# Patient Record
Sex: Female | Born: 1945
Health system: Southern US, Community
[De-identification: ages and names within clinical notes are randomized; demographics above are authoritative.]

## PROBLEM LIST (undated history)

## (undated) DIAGNOSIS — M810 Age-related osteoporosis without current pathological fracture: Secondary | ICD-10-CM

## (undated) DIAGNOSIS — D649 Anemia, unspecified: Secondary | ICD-10-CM

## (undated) DIAGNOSIS — B001 Herpesviral vesicular dermatitis: Secondary | ICD-10-CM

## (undated) DIAGNOSIS — J45909 Unspecified asthma, uncomplicated: Secondary | ICD-10-CM

## (undated) DIAGNOSIS — R011 Cardiac murmur, unspecified: Secondary | ICD-10-CM

## (undated) DIAGNOSIS — T7840XA Allergy, unspecified, initial encounter: Secondary | ICD-10-CM

## (undated) DIAGNOSIS — Z860100 Personal history of colon polyps, unspecified: Secondary | ICD-10-CM

## (undated) DIAGNOSIS — M1990 Unspecified osteoarthritis, unspecified site: Secondary | ICD-10-CM

## (undated) HISTORY — PX: EYE SURGERY: SHX253

## (undated) HISTORY — DX: Herpesviral vesicular dermatitis: B00.1

## (undated) HISTORY — DX: Allergy, unspecified, initial encounter: T78.40XA

## (undated) HISTORY — DX: Unspecified asthma, uncomplicated: J45.909

## (undated) HISTORY — DX: Age-related osteoporosis without current pathological fracture: M81.0

## (undated) HISTORY — DX: Personal history of colonic polyps: Z86.010

## (undated) HISTORY — DX: Cardiac murmur, unspecified: R01.1

## (undated) HISTORY — DX: Anemia, unspecified: D64.9

## (undated) HISTORY — DX: Personal history of colon polyps, unspecified: Z86.0100

## (undated) HISTORY — DX: Unspecified osteoarthritis, unspecified site: M19.90

---

## 1981-10-19 HISTORY — PX: DILATION AND CURETTAGE OF UTERUS: SHX78

## 1999-10-20 DIAGNOSIS — Z5189 Encounter for other specified aftercare: Secondary | ICD-10-CM

## 1999-10-20 HISTORY — DX: Encounter for other specified aftercare: Z51.89

## 2010-10-19 HISTORY — PX: ABDOMINAL HYSTERECTOMY: SHX81

## 2013-10-19 HISTORY — PX: KNEE ARTHROSCOPY: SHX127

## 2016-10-19 LAB — HM COLONOSCOPY

## 2016-10-29 DIAGNOSIS — H25013 Cortical age-related cataract, bilateral: Secondary | ICD-10-CM | POA: Insufficient documentation

## 2016-11-09 DIAGNOSIS — M810 Age-related osteoporosis without current pathological fracture: Secondary | ICD-10-CM | POA: Insufficient documentation

## 2016-11-09 LAB — HM DEXA SCAN

## 2017-10-21 DIAGNOSIS — B001 Herpesviral vesicular dermatitis: Secondary | ICD-10-CM | POA: Insufficient documentation

## 2018-10-19 DIAGNOSIS — Z5189 Encounter for other specified aftercare: Secondary | ICD-10-CM

## 2018-10-19 HISTORY — DX: Encounter for other specified aftercare: Z51.89

## 2019-08-14 DIAGNOSIS — D72819 Decreased white blood cell count, unspecified: Secondary | ICD-10-CM | POA: Insufficient documentation

## 2020-09-03 ENCOUNTER — Encounter: Payer: Self-pay | Admitting: Physician Assistant

## 2020-09-03 ENCOUNTER — Other Ambulatory Visit: Payer: Self-pay

## 2020-09-03 ENCOUNTER — Ambulatory Visit (INDEPENDENT_AMBULATORY_CARE_PROVIDER_SITE_OTHER): Payer: Medicare PPO | Admitting: Physician Assistant

## 2020-09-03 VITALS — BP 130/70 | HR 64 | Temp 98.2°F | Resp 16 | Ht 65.5 in | Wt 138.0 lb

## 2020-09-03 DIAGNOSIS — B001 Herpesviral vesicular dermatitis: Secondary | ICD-10-CM

## 2020-09-03 DIAGNOSIS — H9313 Tinnitus, bilateral: Secondary | ICD-10-CM

## 2020-09-03 DIAGNOSIS — K219 Gastro-esophageal reflux disease without esophagitis: Secondary | ICD-10-CM

## 2020-09-03 DIAGNOSIS — R011 Cardiac murmur, unspecified: Secondary | ICD-10-CM

## 2020-09-03 DIAGNOSIS — Z86010 Personal history of colonic polyps: Secondary | ICD-10-CM | POA: Diagnosis not present

## 2020-09-03 DIAGNOSIS — J302 Other seasonal allergic rhinitis: Secondary | ICD-10-CM | POA: Insufficient documentation

## 2020-09-03 LAB — COMPREHENSIVE METABOLIC PANEL
ALT: 15 U/L (ref 0–35)
AST: 18 U/L (ref 0–37)
Albumin: 4.5 g/dL (ref 3.5–5.2)
Alkaline Phosphatase: 64 U/L (ref 39–117)
BUN: 20 mg/dL (ref 6–23)
CO2: 29 mEq/L (ref 19–32)
Calcium: 9.3 mg/dL (ref 8.4–10.5)
Chloride: 104 mEq/L (ref 96–112)
Creatinine, Ser: 0.74 mg/dL (ref 0.40–1.20)
GFR: 80.22 mL/min (ref >60.00)
Glucose, Bld: 74 mg/dL (ref 70–99)
Potassium: 4.6 mEq/L (ref 3.5–5.1)
Sodium: 139 mEq/L (ref 135–145)
Total Bilirubin: 0.5 mg/dL (ref 0.2–1.2)
Total Protein: 7 g/dL (ref 6.0–8.3)

## 2020-09-03 LAB — CBC WITH DIFFERENTIAL/PLATELET
Basophils Absolute: 0 10*3/uL (ref 0.0–0.1)
Basophils Relative: 0.6 % (ref 0.0–3.0)
Eosinophils Absolute: 0.1 10*3/uL (ref 0.0–0.7)
Eosinophils Relative: 3.4 % (ref 0.0–5.0)
HCT: 41.2 % (ref 36.0–46.0)
Hemoglobin: 13.9 g/dL (ref 12.0–15.0)
Lymphocytes Relative: 31.5 % (ref 12.0–46.0)
Lymphs Abs: 1.3 10*3/uL (ref 0.7–4.0)
MCHC: 33.6 g/dL (ref 30.0–36.0)
MCV: 92.1 fl (ref 78.0–100.0)
Monocytes Absolute: 0.4 10*3/uL (ref 0.1–1.0)
Monocytes Relative: 8.4 % (ref 3.0–12.0)
Neutro Abs: 2.3 10*3/uL (ref 1.4–7.7)
Neutrophils Relative %: 56.1 % (ref 43.0–77.0)
Platelets: 235 10*3/uL (ref 150.0–400.0)
RBC: 4.48 Mil/uL (ref 3.87–5.11)
RDW: 13.4 % (ref 11.5–15.5)
WBC: 4.2 10*3/uL (ref 4.0–10.5)

## 2020-09-03 LAB — VITAMIN B12: Vitamin B-12: 742 pg/mL (ref 211–911)

## 2020-09-03 MED ORDER — PANTOPRAZOLE SODIUM 40 MG PO TBEC: 40.0000 mg | DELAYED_RELEASE_TABLET | Freq: Every day | ORAL | 3 refills | Status: DC

## 2020-09-03 MED ORDER — VALACYCLOVIR HCL 1 G PO TABS: 1000.0000 mg | ORAL_TABLET | Freq: Two times a day (BID) | ORAL | 1 refills | Status: DC

## 2020-09-03 NOTE — Progress Notes (Signed)
Patient presents to clinic today to establish care.  Acute Concerns: Patient endorses 1+ month of constant ringing in the ears bilaterally with L > R. Denies any dizziness, ear pressure or popping. Notes some slight change in hearing bilaterally.. Notes having a hearing assessment in the summer -- some mild hearing loss noted at that time. Will need records.   Patient also having intermittent episodes of heartburn with upper abdominal discomfort, bloating and cramping. Denies nausea or vomiting. Denies chronic cough or globus sensation. Denies blood in the stool. Occasionally taking Pepto Bismol. .   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   Osteoporosis -- notes on last DEXA in 2019. She states she takesher Calcium and OTC Vitamin D. Stays active. Refuses to start medication for the osteoporosis.   Health Maintenance: Immunizations -- up-to-date.   Colonoscopy -- up-to-date.  Mammogram -- 2021 per patient. Records requested.  Past Medical History:  Diagnosis Date   Fever blister    History of colon polyps     Past Surgical History:  Procedure Laterality Date   ABDOMINAL HYSTERECTOMY  2012    Current Outpatient Medications on File Prior to Visit  Medication Sig Dispense Refill   valACYclovir (VALTREX) 1000 MG tablet Take 1,000 mg by mouth 2 (two) times daily.     No current facility-administered medications on file prior to visit.    Allergies  Allergen Reactions   Clindamycin Rash   Erythromycin Base Rash   Levofloxacin Rash    History reviewed. No pertinent family history.  Social History   Socioeconomic History   Marital status: Married    Spouse name: Not on file   Number of children: Not on file   Years of education: Not on file   Highest education level: Not on file  Occupational History   Not on file  Tobacco Use   Smoking status: Never Smoker   Smokeless tobacco: Never Used  Vaping Use   Vaping Use:  Never used  Substance and Sexual Activity   Alcohol use: Not Currently   Drug use: Never   Sexual activity: Not Currently    Birth control/protection: Surgical  Other Topics Concern   Not on file  Social History Narrative   Not on file   Social Determinants of Health   Financial Resource Strain:    Difficulty of Paying Living Expenses: Not on file  Food Insecurity:    Worried About [place] in the Last Year: Not on file   Ran Out of Food in the Last Year: Not on file  Transportation Needs:    Lack of Transportation (Medical): Not on file   Lack of Transportation (Non-Medical): Not on file  Physical Activity:    Days of Exercise per Week: Not on file   Minutes of Exercise per Session: Not on file  Stress:    Feeling of Stress : Not on file  Social Connections:    Frequency of Communication with Friends and Family: Not on file   Frequency of Social Gatherings with Friends and Family: Not on file   Attends Religious Services: Not on file   Active Member of Clubs or Organizations: Not on file   Attends Archivist Meetings: Not on file   Marital Status: Not on file  Intimate Partner Violence:    Fear of Current or Ex-Partner: Not on file   Emotionally Abused: Not on file   Physically Abused: Not on file   Sexually Abused:  Not on file   ROS Pertinent ROS are listed in the HPI.   BP 130/70    Pulse 64    Temp 98.2 F (36.8 C) (Temporal)    Resp 16    Ht 5' 5.5" (1.664 m)    Wt 138 lb (62.6 kg)    SpO2 99%    BMI 22.62 kg/m   Physical Exam Vitals reviewed.  Constitutional:      Appearance: Normal appearance.  HENT:     Head: Normocephalic and atraumatic.     Right Ear: Tympanic membrane and ear canal normal.     Left Ear: Tympanic membrane normal.     Mouth/Throat:     Mouth: Mucous membranes are moist.  Cardiovascular:     Rate and Rhythm: Normal rate and regular rhythm.     Pulses: Normal pulses.     Heart sounds: No  murmur (chronic SEM, II.VI) heard.   Pulmonary:     Effort: Pulmonary effort is normal.     Breath sounds: Normal breath sounds.  Abdominal:     General: There is no distension.     Palpations: There is no mass.     Tenderness: There is no abdominal tenderness. There is no guarding or rebound.  Musculoskeletal:     Cervical back: Normal range of motion and neck supple.  Neurological:     General: No focal deficit present.     Mental Status: She is alert and oriented to person, place, and time.  Psychiatric:        Mood and Affect: Mood normal.    Assessment/Plan: 1. Gastroesophageal reflux disease without esophagitis Will check CBC, CMP and H pylor today. Will start Protonix 40 mg daily. Dietary recommendations and OTC medications reviewed. Will adjust treatment based on results and response to PPI trial.  - CBC with Differential/Platelet - Comprehensive metabolic panel - H. pylori antibody, IgG  2. Bilateral tinnitus Likely related to mild hearing loss. b12 level today. If norma can refer to ENT for further evaluation.  - B12  3. Fever blister Continue Valtrex PRN for rare outbreak.  - valACYclovir (VALTREX) 1000 MG tablet; Take 1 tablet (1,000 mg total) by mouth 2 (two) times daily.  Dispense: 60 tablet; Refill: 1  4. History of colon polyps UTD on colonoscopy. Followed by GI in [place]. Records requested.   5. Heart murmur Asymptomatic. Continue surveillance withCardiology.   This visit occurred during the SARS-CoV-2 public health emergency.  Safety protocols were in place, including screening questions prior to the visit, additional usage of staff PPE, and extensive cleaning of exam room while observing appropriate contact time as indicated for disinfecting solutions.     Leeanne Rio, PA-C

## 2020-09-03 NOTE — Patient Instructions (Addendum)
Please go to the lab today for blood work.  I will call you with your results. We will alter treatment regimen(s) if indicated by your results.   Please take the Protonix once daily.  Follow the dietary recommendations below. If labs are negative and symptoms not improving we will get a further assessment.   For the ringing in the ears, I am checking some levels today. Most likely this is related to hearing loss but we need to assess. If labs are negative I will set you up with ENT.   It was very nice meeting you today. Welcome to [facility]!

## 2020-09-04 LAB — H. PYLORI ANTIBODY, IGG: H Pylori IgG: NEGATIVE

## 2020-09-18 ENCOUNTER — Ambulatory Visit: Payer: Medicare PPO | Admitting: Physician Assistant

## 2020-09-25 ENCOUNTER — Ambulatory Visit: Payer: Medicare PPO | Admitting: Physician Assistant

## 2020-09-25 ENCOUNTER — Other Ambulatory Visit: Payer: Self-pay

## 2020-09-25 ENCOUNTER — Encounter: Payer: Self-pay | Admitting: Physician Assistant

## 2020-09-25 VITALS — BP 122/80 | HR 65 | Temp 98.4°F | Resp 14 | Ht 65.5 in | Wt 138.0 lb

## 2020-09-25 DIAGNOSIS — H9313 Tinnitus, bilateral: Secondary | ICD-10-CM

## 2020-09-25 DIAGNOSIS — K219 Gastro-esophageal reflux disease without esophagitis: Secondary | ICD-10-CM | POA: Diagnosis not present

## 2020-09-25 NOTE — Progress Notes (Signed)
Patient presents to clinic today for follow-up of GERD. At last visit patient was placed on a 2-week trial of Pantoprazole and GERD diet for her symptoms. Endorses taking medication as directed with improvement in her symptoms. Notes increase in arthritic pain of her hands and so she stopped PPI with resolution of this. Has been continuing to watch her diet without recurrence of symptoms.   Past Medical History:  Diagnosis Date  . Allergy   . Anemia   . Arthritis   . Asthma   . Blood transfusion without reported diagnosis 2020  . Fever blister   . Heart murmur   . History of colon polyps   . Osteoporosis     Current Outpatient Medications on File Prior to Visit  Medication Sig Dispense Refill  . pantoprazole (PROTONIX) 40 MG tablet Take 1 tablet (40 mg total) by mouth daily. (Patient not taking: Reported on 09/25/2020) 30 tablet 3  . valACYclovir (VALTREX) 1000 MG tablet Take 1 tablet (1,000 mg total) by mouth 2 (two) times daily. 60 tablet 1   No current facility-administered medications on file prior to visit.    Allergies  Allergen Reactions  . Clindamycin Rash  . Erythromycin Base Rash  . Levofloxacin Rash    Family History  Problem Relation Age of Onset  . Arthritis Mother   . Hearing loss Mother   . Miscarriages / [place] Mother   . Stroke Mother   . Cancer Father   . Heart attack Father   . Stroke Father   . Stroke Maternal Grandmother   . Stroke Maternal Grandfather   . Stroke Paternal Grandmother   . Stroke Paternal Grandfather   . Arthritis Brother   . Cancer Brother     Social History   Socioeconomic History  . Marital status: Married    Spouse name: Not on file  . Number of children: Not on file  . Years of education: Not on file  . Highest education level: Not on file  Occupational History  . Not on file  Tobacco Use  . Smoking status: Former Smoker    Types: Cigarettes  . Smokeless tobacco: Never Used  Vaping Use  . Vaping Use: Never  used  Substance and Sexual Activity  . Alcohol use: Not Currently  . Drug use: Never  . Sexual activity: Not Currently    Birth control/protection: Surgical  Other Topics Concern  . Not on file  Social History Narrative  . Not on file   Social Determinants of Health   Financial Resource Strain:   . Difficulty of Paying Living Expenses: Not on file  Food Insecurity:   . Worried About [employer] in the Last Year: Not on file  . Ran Out of Food in the Last Year: Not on file  Transportation Needs:   . Lack of Transportation (Medical): Not on file  . Lack of Transportation (Non-Medical): Not on file  Physical Activity:   . Days of Exercise per Week: Not on file  . Minutes of Exercise per Session: Not on file  Stress:   . Feeling of Stress : Not on file  Social Connections:   . Frequency of Communication with Friends and Family: Not on file  . Frequency of Social Gatherings with Friends and Family: Not on file  . Attends Religious Services: Not on file  . Active Member of Clubs or Organizations: Not on file  . Attends Archivist Meetings: Not on file  . Marital  Status: Not on file    Review of Systems - See HPI.  All other ROS are negative.  There were no vitals taken for this visit.  Physical Exam  Recent Results (from the past 2160 hour(s))  CBC with Differential/Platelet     Status: None   Collection Time: 09/03/20 11:21 AM  Result Value Ref Range   WBC 4.2 4.0 - 10.5 K/uL   RBC 4.48 3.87 - 5.11 Mil/uL   Hemoglobin 13.9 12.0 - 15.0 g/dL   HCT 41.2 36 - 46 %   MCV 92.1 78.0 - 100.0 fl   MCHC 33.6 30.0 - 36.0 g/dL   RDW 13.4 11.5 - 15.5 %   Platelets 235.0 150 - 400 K/uL   Neutrophils Relative % 56.1 43 - 77 %   Lymphocytes Relative 31.5 12 - 46 %   Monocytes Relative 8.4 3 - 12 %   Eosinophils Relative 3.4 0 - 5 %   Basophils Relative 0.6 0 - 3 %   Neutro Abs 2.3 1.4 - 7.7 K/uL   Lymphs Abs 1.3 0.7 - 4.0 K/uL   Monocytes Absolute 0.4 0.1 -  1.0 K/uL   Eosinophils Absolute 0.1 0.0 - 0.7 K/uL   Basophils Absolute 0.0 0.0 - 0.1 K/uL  Comprehensive metabolic panel     Status: None   Collection Time: 09/03/20 11:21 AM  Result Value Ref Range   Sodium 139 135 - 145 mEq/L   Potassium 4.6 3.5 - 5.1 mEq/L   Chloride 104 96 - 112 mEq/L   CO2 29 19 - 32 mEq/L   Glucose, Bld 74 70 - 99 mg/dL   BUN 20 6 - 23 mg/dL   Creatinine, Ser 0.74 0.40 - 1.20 mg/dL   Total Bilirubin 0.5 0.2 - 1.2 mg/dL   Alkaline Phosphatase 64 39 - 117 U/L   AST 18 0 - 37 U/L   ALT 15 0 - 35 U/L   Total Protein 7.0 6.0 - 8.3 g/dL   Albumin 4.5 3.5 - 5.2 g/dL   GFR 80.22 >60.00 mL/min    Comment: Calculated using the CKD-EPI Creatinine Equation (2021)   Calcium 9.3 8.4 - 10.5 mg/dL  B12     Status: None   Collection Time: 09/03/20 11:21 AM  Result Value Ref Range   Vitamin B-12 742 211 - 911 pg/mL  H. pylori antibody, IgG     Status: None   Collection Time: 09/03/20 11:21 AM  Result Value Ref Range   H Pylori IgG Negative Negative    Assessment/Plan: 1. Gastroesophageal reflux disease without esophagitis Doing well now even off of her PPI. Protonix added to intolerance list. Continue GERD diet. Can consider PRN H2 blocker -- Famotidine recommended. Follow-up prn for recurring symptoms.   2. Bilateral tinnitus B12 normal on recent labs. Will proceed with ENT assessment. Referral placed.   This visit occurred during the SARS-CoV-2 public health emergency.  Safety protocols were in place, including screening questions prior to the visit, additional usage of staff PPE, and extensive cleaning of exam room while observing appropriate contact time as indicated for disinfecting solutions.     Leeanne Rio, PA-C

## 2020-09-25 NOTE — Patient Instructions (Signed)
I am glad things are much improved.  Continue following dietary recommendations below.  You can use OTC Pepcid (Famotidine) when needed for rare symptom recurrence.  If you note symptoms recurring and persistent, please contact me.   You will be contacted by ENT for further evaluation of tinnitus.    Food Choices for Gastroesophageal Reflux Disease, Adult When you have gastroesophageal reflux disease (GERD), the foods you eat and your eating habits are very important. Choosing the right foods can help ease your discomfort. Think about working with a nutrition specialist (dietitian) to help you make good choices. What are tips for following this plan?  Meals  Choose healthy foods that are low in fat, such as fruits, vegetables, whole grains, low-fat dairy products, and lean meat, fish, and poultry.  Eat small meals often instead of 3 large meals a day. Eat your meals slowly, and in a place where you are relaxed. Avoid bending over or lying down until 2-3 hours after eating.  Avoid eating meals 2-3 hours before bed.  Avoid drinking a lot of liquid with meals.  Cook foods using methods other than frying. Bake, grill, or broil food instead.  Avoid or limit: ? Chocolate. ? Peppermint or spearmint. ? Alcohol. ? Pepper. ? Black and decaffeinated coffee. ? Black and decaffeinated tea. ? Bubbly (carbonated) soft drinks. ? Caffeinated energy drinks and soft drinks.  Limit high-fat foods such as: ? Fatty meat or fried foods. ? Whole milk, cream, butter, or ice cream. ? Nuts and nut butters. ? Pastries, donuts, and sweets made with butter or shortening.  Avoid foods that cause symptoms. These foods may be different for everyone. Common foods that cause symptoms include: ? Tomatoes. ? Oranges, lemons, and limes. ? Peppers. ? Spicy food. ? Onions and garlic. ? Vinegar. Lifestyle  Maintain a healthy weight. Ask your doctor what weight is healthy for you. If you need to lose weight,  work with your doctor to do so safely.  Exercise for at least 30 minutes for 5 or more days each week, or as told by your doctor.  Wear loose-fitting clothes.  Do not smoke. If you need help quitting, ask your doctor.  Sleep with the head of your bed higher than your feet. Use a wedge under the mattress or blocks under the bed frame to raise the head of the bed. Summary  When you have gastroesophageal reflux disease (GERD), food and lifestyle choices are very important in easing your symptoms.  Eat small meals often instead of 3 large meals a day. Eat your meals slowly, and in a place where you are relaxed.  Limit high-fat foods such as fatty meat or fried foods.  Avoid bending over or lying down until 2-3 hours after eating.  Avoid peppermint and spearmint, caffeine, alcohol, and chocolate. This information is not intended to replace advice given to you by your health care provider. Make sure you discuss any questions you have with your health care provider. Document Revised: 01/26/2019 Document Reviewed: 11/10/2016 Elsevier Patient Education  [place].

## 2020-10-10 NOTE — Progress Notes (Signed)
Subjective:   Sylvia Bowen is a 74 y.o. female who presents for an Initial Medicare Annual Wellness Visit.  I connected with Christeen today by telephone and verified that I am speaking with the correct person using two identifiers. Location patient: home Location provider: work Persons participating in the virtual visit: patient, Engineer, civil (consulting).    I discussed the limitations, risks, security and privacy concerns of performing an evaluation and management service by telephone and the availability of in person appointments. I also discussed with the patient that there may be a patient responsible charge related to this service. The patient expressed understanding and verbally consented to this telephonic visit.    Interactive audio and video telecommunications were attempted between this provider and patient, however failed, due to patient having technical difficulties OR patient did not have access to video capability.  We continued and completed visit with audio only.  Some vital signs may be absent or patient reported.   Time Spent with patient on telephone encounter: 20 minutes  Review of Systems     Cardiac Risk Factors include: advanced age (>74men, >51 women)     Objective:    Today's Vitals   10/14/20 1332 10/14/20 1333  Weight: 138 lb (62.6 kg)   Height: 5' 5.5" (1.664 m)   PainSc:  4    Body mass index is 22.62 kg/m.  Advanced Directives 10/14/2020  Does Patient Have a Medical Advance Directive? No  Would patient like information on creating a medical advance directive? No - Patient declined    Current Medications (verified) Outpatient Encounter Medications as of 10/14/2020  Medication Sig  . ibuprofen (ADVIL) 600 MG tablet Take by mouth.  . valACYclovir (VALTREX) 1000 MG tablet Take 1 tablet (1,000 mg total) by mouth 2 (two) times daily.  . Cholecalciferol 25 MCG (1000 UT) tablet Take by mouth.  . pantoprazole (PROTONIX) 40 MG tablet Take 1 tablet (40 mg total) by  mouth daily. (Patient not taking: Reported on 10/14/2020)   No facility-administered encounter medications on file as of 10/14/2020.    Allergies (verified) Clindamycin, Erythromycin base, and Levofloxacin   History: Past Medical History:  Diagnosis Date  . Allergy   . Anemia   . Arthritis   . Asthma   . Blood transfusion without reported diagnosis 2020  . Fever blister   . Heart murmur   . History of colon polyps   . Osteoporosis    Past Surgical History:  Procedure Laterality Date  . ABDOMINAL HYSTERECTOMY  2012  . DILATION AND CURETTAGE OF UTERUS  1983  . KNEE ARTHROSCOPY  2015   Family History  Problem Relation Age of Onset  . Arthritis Mother   . Hearing loss Mother   . Miscarriages / [place] Mother   . Stroke Mother   . Cancer Father   . Heart attack Father   . Stroke Father   . Stroke Maternal Grandmother   . Stroke Maternal Grandfather   . Stroke Paternal Grandmother   . Stroke Paternal Grandfather   . Arthritis Brother   . Cancer Brother    Social History   Socioeconomic History  . Marital status: Married    Spouse name: Not on file  . Number of children: Not on file  . Years of education: Not on file  . Highest education level: Not on file  Occupational History  . Not on file  Tobacco Use  . Smoking status: Former Smoker    Types: Cigarettes  . Smokeless tobacco:  Never Used  Vaping Use  . Vaping Use: Never used  Substance and Sexual Activity  . Alcohol use: Not Currently  . Drug use: Never  . Sexual activity: Not Currently    Birth control/protection: Surgical  Other Topics Concern  . Not on file  Social History Narrative  . Not on file   Social Determinants of Health   Financial Resource Strain: Low Risk   . Difficulty of Paying Living Expenses: Not hard at all  Food Insecurity: No Food Insecurity  . Worried About Charity fundraiser in the Last Year: Never true  . Ran Out of Food in the Last Year: Never true  Transportation  Needs: No Transportation Needs  . Lack of Transportation (Medical): No  . Lack of Transportation (Non-Medical): No  Physical Activity: Inactive  . Days of Exercise per Week: 0 days  . Minutes of Exercise per Session: 0 min  Stress: No Stress Concern Present  . Feeling of Stress : Not at all  Social Connections: Moderately Integrated  . Frequency of Communication with Friends and Family: More than three times a week  . Frequency of Social Gatherings with Friends and Family: More than three times a week  . Attends Religious Services: More than 4 times per year  . Active Member of Clubs or Organizations: No  . Attends Archivist Meetings: Never  . Marital Status: Married    Tobacco Counseling Counseling given: Not Answered   Clinical Intake:  Pre-visit preparation completed: Yes  Pain : 0-10 Pain Score: 4  Pain Type: Acute pain Pain Location: Rib cage Pain Orientation: Right Pain Onset: In the past 7 days Pain Frequency: Intermittent     Nutritional Status: BMI of 19-24  Normal Nutritional Risks: None Diabetes: No  How often do you need to have someone help you when you read instructions, pamphlets, or other written materials from your doctor or pharmacy?: 1 - Never What is the last grade level you completed in school?: Master's degree  Diabetic?No  Interpreter Needed?: No  Information entered by :: Caroleen Hamman LPN   Activities of Daily Living In your present state of health, do you have any difficulty performing the following activities: 10/14/2020 09/03/2020  Hearing? N N  Vision? N N  Difficulty concentrating or making decisions? N N  Walking or climbing stairs? N N  Dressing or bathing? N N  Doing errands, shopping? N N  Preparing Food and eating ? N -  Using the Toilet? N -  In the past six months, have you accidently leaked urine? N -  Do you have problems with loss of bowel control? N -  Managing your Medications? N -  Managing your  Finances? N -  Housekeeping or managing your Housekeeping? N -  Some recent data might be hidden    Patient Care Team: Brunetta Jeans, PA-C as PCP - General (Family Medicine) Lars Masson, MD as Referring Physician (Cardiology)  Indicate any recent Medical Services you may have received from other than [facility] providers in the past year (date may be approximate).     Assessment:   This is a routine wellness examination for Siennah.  Hearing/Vision screen  Hearing Screening   125Hz  250Hz  500Hz  1000Hz  2000Hz  3000Hz  4000Hz  6000Hz  8000Hz   Right ear:           Left ear:           Comments: Has an upcoming appt with a hearing specialist for tinnitus  Vision Screening  Comments: Wears glasses Last eye exam-2021-[facility]  Dietary issues and exercise activities discussed: Current Exercise Habits: Home exercise routine, Type of exercise: stretching;yoga, Time (Minutes): 60, Frequency (Times/Week): 2, Weekly Exercise (Minutes/Week): 120, Intensity: Mild, Exercise limited by: None identified  Goals    . Patient Stated     Increase activity, drink more water & eat healthier      Depression Screen PHQ 2/9 Scores 10/14/2020 09/03/2020  PHQ - 2 Score 0 0  PHQ- 9 Score - 0    Fall Risk Fall Risk  10/14/2020 09/03/2020  Falls in the past year? 1 0  Number falls in past yr: 0 0  Injury with Fall? 1 0  Risk for fall due to : History of fall(s) -  Follow up Falls prevention discussed Falls evaluation completed    FALL RISK PREVENTION PERTAINING TO THE HOME:  Any stairs in or around the home? No  Home free of loose throw rugs in walkways, pet beds, electrical cords, etc? Yes  Adequate lighting in your home to reduce risk of falls? Yes   ASSISTIVE DEVICES UTILIZED TO PREVENT FALLS:  Life alert? No  Use of a cane, walker or w/c? No  Grab bars in the bathroom? No  Shower chair or bench in shower? No  Elevated toilet seat or a handicapped toilet? No   TIMED UP AND  GO:  Was the test performed? No . Phone visit   Cognitive Function:Normal cognitive status assessed by  this Nurse Health Advisor. No abnormalities found.          Immunizations Immunization History  Administered Date(s) Administered  . Influenza, High Dose Seasonal PF 06/17/2017, 07/21/2019  . Janssen (J&J) SARS-COV-2 Vaccination 01/29/2020  . Pneumococcal Conjugate-13 09/24/2016  . Pneumococcal Polysaccharide-23 10/21/2017  . Tdap 10/19/2013  . Zoster Recombinat (Shingrix) 08/11/2019, 10/31/2019    TDAP status: Up to date  Flu Vaccine status: Due, Education has been provided regarding the importance of this vaccine. Advised may receive this vaccine at local pharmacy or Health Dept. Aware to provide a copy of the vaccination record if obtained from local pharmacy or Health Dept. Verbalized acceptance and understanding.  Pneumococcal vaccine status: Up to date  Covid-19 vaccine status: Completed vaccines  Qualifies for Shingles Vaccine? No   Zostavax completed No   Shingrix Completed?: Yes  Screening Tests Health Maintenance  Topic Date Due  . Hepatitis C Screening  Never done  . MAMMOGRAM  Never done  . COVID-19 Vaccine (2 - Booster for [employer] series) 03/25/2020  . INFLUENZA VACCINE  05/19/2020  . DTAP VACCINES (1) 03/26/2079 (Originally 05/05/1946)  . DTaP/Tdap/Td (2 - Td or Tdap) 10/20/2023  . TETANUS/TDAP  10/20/2023  . COLONOSCOPY (Pts 45-24yrs Insurance coverage will need to be confirmed)  10/19/2026  . DEXA SCAN  Completed  . PNA vac Low Risk Adult  Completed    Health Maintenance  Health Maintenance Due  Topic Date Due  . Hepatitis C Screening  Never done  . MAMMOGRAM  Never done  . COVID-19 Vaccine (2 - Booster for [employer] series) 03/25/2020  . INFLUENZA VACCINE  05/19/2020    Colorectal cancer screening: Type of screening: Colonoscopy. Completed 10/19/2016. Repeat every 10 years  Mammogram status: Completed Bilateral 2021-per patient. Repeat every  year. Requested patient have a copy of report sent to PCP  Bone density status:Mammogram status: Completed 2021-per patient. Requested patient have a copy of report sent to PCP  Lung Cancer Screening: (Low Dose CT Chest recommended if Age  55-80 years, 30 pack-year currently smoking OR have quit w/in 15years.) does not qualify.     Additional Screening:  Hepatitis C Screening: does qualify; Discuss with PCP  Vision Screening: Recommended annual ophthalmology exams for early detection of glaucoma and other disorders of the eye. Is the patient up to date with their annual eye exam?  Yes  Who is the provider or what is the name of the office in which the patient attends annual eye exams? [facility]   Dental Screening: Recommended annual dental exams for proper oral hygiene  Community Resource Referral / Chronic Care Management: CRR required this visit?  No   CCM required this visit?  No      Plan:     I have personally reviewed and noted the following in the patient's chart:   . Medical and social history . Use of alcohol, tobacco or illicit drugs  . Current medications and supplements . Functional ability and status . Nutritional status . Physical activity . Advanced directives . List of other physicians . Hospitalizations, surgeries, and ER visits in previous 12 months . Vitals . Screenings to include cognitive, depression, and falls . Referrals and appointments  In addition, I have reviewed and discussed with patient certain preventive protocols, quality metrics, and best practice recommendations. A written personalized care plan for preventive services as well as general preventive health recommendations were provided to patient.   Due to this being a telephonic visit, the after visit summary with patients personalized plan was offered to patient via mail or my-chart. Patient declined at this time.   Marta Antu, LPN   [id number]  Nurse Health  Advisor  Nurse Notes: None

## 2020-10-14 ENCOUNTER — Other Ambulatory Visit: Payer: Self-pay

## 2020-10-14 ENCOUNTER — Ambulatory Visit (INDEPENDENT_AMBULATORY_CARE_PROVIDER_SITE_OTHER): Payer: Medicare PPO

## 2020-10-14 VITALS — Ht 65.5 in | Wt 138.0 lb

## 2020-10-14 DIAGNOSIS — Z0000 Encounter for general adult medical examination without abnormal findings: Secondary | ICD-10-CM

## 2020-10-14 NOTE — Patient Instructions (Signed)
Sylvia Bowen , Thank you for taking time to complete your Medicare Wellness Visit. I appreciate your ongoing commitment to your health goals. Please review the following plan we discussed and let me know if I can assist you in the future.   Screening recommendations/referrals: Colonoscopy: Completed 10/19/2016-Not required after age 73. Mammogram: Per our conversation, completed 2021. Please have copy of results sent to PCP. Bone Density: Per our conversation, completed 2021. Please have copy of results sent to PCP Recommended yearly ophthalmology/optometry visit for glaucoma screening and checkup Recommended yearly dental visit for hygiene and checkup  Vaccinations: Influenza vaccine: Due- May obtain vaccine at our office or your local pharmacy. Pneumococcal vaccine: Completed vaccines Tdap vaccine: Up to date. Due-10/20/2023 Shingles vaccine: Completed vaccines  Covid-19:Completed vaccines  Advanced directives: Please bring a copy for your chart  Conditions/risks identified: See problem list  Next appointment: Follow up in one year for your annual wellness visit    Preventive Care 65 Years and Older, Female Preventive care refers to lifestyle choices and visits with your health care provider that can promote health and wellness. What does preventive care include?  A yearly physical exam. This is also called an annual well check.  Dental exams once or twice a year.  Routine eye exams. Ask your health care provider how often you should have your eyes checked.  Personal lifestyle choices, including:  Daily care of your teeth and gums.  Regular physical activity.  Eating a healthy diet.  Avoiding tobacco and drug use.  Limiting alcohol use.  Practicing safe sex.  Taking low-dose aspirin every day.  Taking vitamin and mineral supplements as recommended by your health care provider. What happens during an annual well check? The services and screenings done by your health care  provider during your annual well check will depend on your age, overall health, lifestyle risk factors, and family history of disease. Counseling  Your health care provider may ask you questions about your:  Alcohol use.  Tobacco use.  Drug use.  Emotional well-being.  Home and relationship well-being.  Sexual activity.  Eating habits.  History of falls.  Memory and ability to understand (cognition).  Work and work Astronomer.  Reproductive health. Screening  You may have the following tests or measurements:  Height, weight, and BMI.  Blood pressure.  Lipid and cholesterol levels. These may be checked every 5 years, or more frequently if you are over 74 years old.  Skin check.  Lung cancer screening. You may have this screening every year starting at age 84 if you have a 30-pack-year history of smoking and currently smoke or have quit within the past 15 years.  Fecal occult blood test (FOBT) of the stool. You may have this test every year starting at age 68.  Flexible sigmoidoscopy or colonoscopy. You may have a sigmoidoscopy every 5 years or a colonoscopy every 10 years starting at age 73.  Hepatitis C blood test.  Hepatitis B blood test.  Sexually transmitted disease (STD) testing.  Diabetes screening. This is done by checking your blood sugar (glucose) after you have not eaten for a while (fasting). You may have this done every 1-3 years.  Bone density scan. This is done to screen for osteoporosis. You may have this done starting at age 53.  Mammogram. This may be done every 1-2 years. Talk to your health care provider about how often you should have regular mammograms. Talk with your health care provider about your test results, treatment options, and  if necessary, the need for more tests. Vaccines  Your health care provider may recommend certain vaccines, such as:  Influenza vaccine. This is recommended every year.  Tetanus, diphtheria, and acellular  pertussis (Tdap, Td) vaccine. You may need a Td booster every 10 years.  Zoster vaccine. You may need this after age 40.  Pneumococcal 13-valent conjugate (PCV13) vaccine. One dose is recommended after age 57.  Pneumococcal polysaccharide (PPSV23) vaccine. One dose is recommended after age 3. Talk to your health care provider about which screenings and vaccines you need and how often you need them. This information is not intended to replace advice given to you by your health care provider. Make sure you discuss any questions you have with your health care provider. Document Released: 11/01/2015 Document Revised: 06/24/2016 Document Reviewed: 08/06/2015 Elsevier Interactive Patient Education  2017 [place] Prevention in the Home Falls can cause injuries. They can happen to people of all ages. There are many things you can do to make your home safe and to help prevent falls. What can I do on the outside of my home?  Regularly fix the edges of walkways and driveways and fix any cracks.  Remove anything that might make you trip as you walk through a door, such as a raised step or threshold.  Trim any bushes or trees on the path to your home.  Use bright outdoor lighting.  Clear any walking paths of anything that might make someone trip, such as rocks or tools.  Regularly check to see if handrails are loose or broken. Make sure that both sides of any steps have handrails.  Any raised decks and porches should have guardrails on the edges.  Have any leaves, snow, or ice cleared regularly.  Use sand or salt on walking paths during winter.  Clean up any spills in your garage right away. This includes oil or grease spills. What can I do in the bathroom?  Use night lights.  Install grab bars by the toilet and in the tub and shower. Do not use towel bars as grab bars.  Use non-skid mats or decals in the tub or shower.  If you need to sit down in the shower, use a plastic,  non-slip stool.  Keep the floor dry. Clean up any water that spills on the floor as soon as it happens.  Remove soap buildup in the tub or shower regularly.  Attach bath mats securely with double-sided non-slip rug tape.  Do not have throw rugs and other things on the floor that can make you trip. What can I do in the bedroom?  Use night lights.  Make sure that you have a light by your bed that is easy to reach.  Do not use any sheets or blankets that are too big for your bed. They should not hang down onto the floor.  Have a firm chair that has side arms. You can use this for support while you get dressed.  Do not have throw rugs and other things on the floor that can make you trip. What can I do in the kitchen?  Clean up any spills right away.  Avoid walking on wet floors.  Keep items that you use a lot in easy-to-reach places.  If you need to reach something above you, use a strong step stool that has a grab bar.  Keep electrical cords out of the way.  Do not use floor polish or wax that makes floors slippery. If you  must use wax, use non-skid floor wax.  Do not have throw rugs and other things on the floor that can make you trip. What can I do with my stairs?  Do not leave any items on the stairs.  Make sure that there are handrails on both sides of the stairs and use them. Fix handrails that are broken or loose. Make sure that handrails are as long as the stairways.  Check any carpeting to make sure that it is firmly attached to the stairs. Fix any carpet that is loose or worn.  Avoid having throw rugs at the top or bottom of the stairs. If you do have throw rugs, attach them to the floor with carpet tape.  Make sure that you have a light switch at the top of the stairs and the bottom of the stairs. If you do not have them, ask someone to add them for you. What else can I do to help prevent falls?  Wear shoes that:  Do not have high heels.  Have rubber  bottoms.  Are comfortable and fit you well.  Are closed at the toe. Do not wear sandals.  If you use a stepladder:  Make sure that it is fully opened. Do not climb a closed stepladder.  Make sure that both sides of the stepladder are locked into place.  Ask someone to hold it for you, if possible.  Clearly mark and make sure that you can see:  Any grab bars or handrails.  First and last steps.  Where the edge of each step is.  Use tools that help you move around (mobility aids) if they are needed. These include:  Canes.  Walkers.  Scooters.  Crutches.  Turn on the lights when you go into a dark area. Replace any light bulbs as soon as they burn out.  Set up your furniture so you have a clear path. Avoid moving your furniture around.  If any of your floors are uneven, fix them.  If there are any pets around you, be aware of where they are.  Review your medicines with your doctor. Some medicines can make you feel dizzy. This can increase your chance of falling. Ask your doctor what other things that you can do to help prevent falls. This information is not intended to replace advice given to you by your health care provider. Make sure you discuss any questions you have with your health care provider. Document Released: 08/01/2009 Document Revised: 03/12/2016 Document Reviewed: 11/09/2014 Elsevier Interactive Patient Education  2017 [employer].

## 2020-10-26 ENCOUNTER — Other Ambulatory Visit: Payer: Medicare PPO

## 2020-10-26 DIAGNOSIS — Z20822 Contact with and (suspected) exposure to covid-19: Secondary | ICD-10-CM

## 2020-10-29 ENCOUNTER — Ambulatory Visit (INDEPENDENT_AMBULATORY_CARE_PROVIDER_SITE_OTHER): Payer: Medicare PPO | Admitting: Otolaryngology

## 2020-10-29 ENCOUNTER — Telehealth: Payer: Self-pay | Admitting: Physician Assistant

## 2020-10-29 LAB — NOVEL CORONAVIRUS, NAA: SARS-CoV-2, NAA: DETECTED — AB

## 2020-10-29 NOTE — Telephone Encounter (Signed)
Patient does not understand her covid test results and would like someone to explain them to her.

## 2020-10-30 ENCOUNTER — Telehealth: Payer: Self-pay

## 2020-10-30 NOTE — Telephone Encounter (Signed)
Contacted pt. In regard to COVID 19 infusion therapy. Pt. Is outside 7 day window. States she is feeling better, and will contact her PCP as needed.

## 2020-10-30 NOTE — Telephone Encounter (Signed)
Called pt back, advised about lab results and answered her question. She said she is feeling little bit better each day, today is the first day with no fever, but she still has cough and headaches. Advised to call the office back if needed.

## 2020-11-19 ENCOUNTER — Ambulatory Visit (INDEPENDENT_AMBULATORY_CARE_PROVIDER_SITE_OTHER): Payer: Medicare PPO | Admitting: Otolaryngology

## 2020-11-21 ENCOUNTER — Other Ambulatory Visit: Payer: Self-pay

## 2020-11-21 ENCOUNTER — Ambulatory Visit (INDEPENDENT_AMBULATORY_CARE_PROVIDER_SITE_OTHER): Payer: Medicare PPO | Admitting: Otolaryngology

## 2020-11-21 VITALS — Temp 97.7°F

## 2020-11-21 DIAGNOSIS — H9313 Tinnitus, bilateral: Secondary | ICD-10-CM

## 2020-11-21 DIAGNOSIS — H903 Sensorineural hearing loss, bilateral: Secondary | ICD-10-CM | POA: Diagnosis not present

## 2020-11-21 NOTE — Progress Notes (Signed)
HPI: Sylvia Bowen is a 75 y.o. female who presents is referred by her PCP for evaluation of complaints of ringing in her ears.  She has noticed the ringing in her ears over the past few months and has gradually gotten little bit worse.  She denies having any trouble with her hearing.  She has not had a hearing test yet..  Past Medical History:  Diagnosis Date  . Allergy   . Anemia   . Arthritis   . Asthma   . Blood transfusion without reported diagnosis 2020  . Fever blister   . Heart murmur   . History of colon polyps   . Osteoporosis    Past Surgical History:  Procedure Laterality Date  . ABDOMINAL HYSTERECTOMY  2012  . DILATION AND CURETTAGE OF UTERUS  1983  . KNEE ARTHROSCOPY  2015   Social History   Socioeconomic History  . Marital status: Married    Spouse name: Not on file  . Number of children: Not on file  . Years of education: Not on file  . Highest education level: Not on file  Occupational History  . Not on file  Tobacco Use  . Smoking status: Former Smoker    Types: Cigarettes  . Smokeless tobacco: Never Used  Vaping Use  . Vaping Use: Never used  Substance and Sexual Activity  . Alcohol use: Not Currently  . Drug use: Never  . Sexual activity: Not Currently    Birth control/protection: Surgical  Other Topics Concern  . Not on file  Social History Narrative  . Not on file   Social Determinants of Health   Financial Resource Strain: Low Risk   . Difficulty of Paying Living Expenses: Not hard at all  Food Insecurity: No Food Insecurity  . Worried About Charity fundraiser in the Last Year: Never true  . Ran Out of Food in the Last Year: Never true  Transportation Needs: No Transportation Needs  . Lack of Transportation (Medical): No  . Lack of Transportation (Non-Medical): No  Physical Activity: Inactive  . Days of Exercise per Week: 0 days  . Minutes of Exercise per Session: 0 min  Stress: No Stress Concern Present  . Feeling of Stress : Not  at all  Social Connections: Moderately Integrated  . Frequency of Communication with Friends and Family: More than three times a week  . Frequency of Social Gatherings with Friends and Family: More than three times a week  . Attends Religious Services: More than 4 times per year  . Active Member of Clubs or Organizations: No  . Attends Archivist Meetings: Never  . Marital Status: Married   Family History  Problem Relation Age of Onset  . Arthritis Mother   . Hearing loss Mother   . Miscarriages / [place] Mother   . Stroke Mother   . Cancer Father   . Heart attack Father   . Stroke Father   . Stroke Maternal Grandmother   . Stroke Maternal Grandfather   . Stroke Paternal Grandmother   . Stroke Paternal Grandfather   . Arthritis Brother   . Cancer Brother    Allergies  Allergen Reactions  . Clindamycin Rash  . Erythromycin Base Rash  . Levofloxacin Rash   Prior to Admission medications   Medication Sig Start Date End Date Taking? Authorizing Provider  Cholecalciferol 25 MCG (1000 UT) tablet Take by mouth.    [provider]  pantoprazole (PROTONIX) 40 MG tablet Take  1 tablet (40 mg total) by mouth daily. Patient not taking: Reported on 10/14/2020 09/03/20   Brunetta Jeans, PA-C  valACYclovir (VALTREX) 1000 MG tablet Take 1 tablet (1,000 mg total) by mouth 2 (two) times daily. 09/03/20   Brunetta Jeans, PA-C     Positive ROS: Otherwise negative  All other systems have been reviewed and were otherwise negative with the exception of those mentioned in the HPI and as above.  Physical Exam: Constitutional: Alert, well-appearing, no acute distress Ears: External ears without lesions or tenderness. Ear canals are clear bilaterally.  TMs are clear bilaterally with good mobility on pneumatic otoscopy. Nasal: External nose without lesions.. Clear nasal passages Oral: Lips and gums without lesions. Tongue and palate mucosa without lesions. Posterior  oropharynx clear. Neck: No palpable adenopathy or masses Respiratory: Breathing comfortably  Skin: No facial/neck lesions or rash noted.  Audiologic testing in the office today demonstrated a mild downsloping sensorineural hearing loss in both ears above 2000 frequency.  She had type A tympanograms bilaterally.  SRT's were 15 dB in both ears.  Procedures  Assessment: Tinnitus secondary to mild high-frequency sensorineural hearing loss consistent with presbycusis  Plan: Reviewed with the patient concerning her hearing test which showed a mild high-frequency hearing loss which results in tinnitus.  I discussed with her that there is minimal treatment for tinnitus.  Reviewed with her concerning using masking noise when the tinnitus is bad. Also cautioned her about using ear protection when around loud noise that may make tinnitus worse.   Radene Journey, MD   CC:

## 2020-11-25 ENCOUNTER — Encounter (INDEPENDENT_AMBULATORY_CARE_PROVIDER_SITE_OTHER): Payer: Self-pay

## 2020-12-12 ENCOUNTER — Telehealth (INDEPENDENT_AMBULATORY_CARE_PROVIDER_SITE_OTHER): Payer: Medicare PPO | Admitting: Family Medicine

## 2020-12-12 DIAGNOSIS — R0981 Nasal congestion: Secondary | ICD-10-CM | POA: Diagnosis not present

## 2020-12-12 DIAGNOSIS — R059 Cough, unspecified: Secondary | ICD-10-CM

## 2020-12-12 MED ORDER — BENZONATATE 100 MG PO CAPS: 100.0000 mg | ORAL_CAPSULE | Freq: Three times a day (TID) | ORAL | 0 refills | Status: DC | PRN

## 2020-12-12 NOTE — Patient Instructions (Signed)
  HOME CARE TIPS:   -I sent the medication(s) we discussed to your pharmacy: Meds ordered this encounter  Medications  . benzonatate (TESSALON PERLES) 100 MG capsule    Sig: Take 1 capsule (100 mg total) by mouth 3 (three) times daily as needed.    Dispense:  20 capsule    Refill:  0    -can use tylenol or aleve if needed for fevers, aches and pains per instructions  -can use nasal saline a few times per day if you have nasal congestion  -stay hydrated, drink plenty of fluids and eat small healthy meals - avoid dairy  -can take 1000 IU (38mcg) Vit D3 and 100-500 mg of Vit C daily per instructions  -follow up with your doctor in 2-3 days unless improving and feeling better  -stay home while sick, except to seek medical care  It was nice to meet you today, and I really hope you are feeling better soon. I help [facility] out with telemedicine visits on Tuesdays and Thursdays and am available for visits on those days. If you have any concerns or questions following this visit please schedule a follow up visit with your Primary Care doctor or seek care at a local urgent care clinic to avoid delays in care.    Seek in person care or schedule a follow up video visit promptly if your symptoms worsen, new concerns arise or you are not improving with treatment. Call 911 and/or seek emergency care if your symptoms are severe or life threatening.

## 2020-12-12 NOTE — Progress Notes (Signed)
Virtual Visit via Video Note  I connected with Sylvia Bowen  on 12/12/20 at 12:00 PM EST by a video enabled telemedicine application and verified that I am speaking with the correct person using two identifiers.  Location patient: home, [place] Location provider:work or home office Persons participating in the virtual visit: patient, provider  I discussed the limitations of evaluation and management by telemedicine and the availability of in person appointments. The patient expressed understanding and agreed to proceed.   HPI:  Acute telemedicine visit for : -Onset: 4 days ago -had covid 6 weeks ago but had gotten over that for the most part -Symptoms include: scratchy throat, cough, mild headache, some body aches, nasal congestion -she took a covid test which was negative -Denies: CP, SOB, fevers, NVD, known sick contacts, inability to eat/drink/get out of bed -Has tried: nothing -Pertinent past medical history: seasonal allergies -COVID-19 vaccine status: J and J, no booster -had not had flu shot this year  ROS: See pertinent positives and negatives per HPI.  Past Medical History:  Diagnosis Date  . Allergy   . Anemia   . Arthritis   . Asthma   . Blood transfusion without reported diagnosis 2020  . Fever blister   . Heart murmur   . History of colon polyps   . Osteoporosis     Past Surgical History:  Procedure Laterality Date  . ABDOMINAL HYSTERECTOMY  2012  . DILATION AND CURETTAGE OF UTERUS  1983  . KNEE ARTHROSCOPY  2015     Current Outpatient Medications:  .  benzonatate (TESSALON PERLES) 100 MG capsule, Take 1 capsule (100 mg total) by mouth 3 (three) times daily as needed., Disp: 20 capsule, Rfl: 0 .  Cholecalciferol 25 MCG (1000 UT) tablet, Take by mouth., Disp: , Rfl:  .  pantoprazole (PROTONIX) 40 MG tablet, Take 1 tablet (40 mg total) by mouth daily. (Patient not taking: Reported on 10/14/2020), Disp: 30 tablet, Rfl: 3 .  valACYclovir (VALTREX) 1000 MG tablet, Take  1 tablet (1,000 mg total) by mouth 2 (two) times daily., Disp: 60 tablet, Rfl: 1  EXAM:  VITALS per patient if applicable:  GENERAL: alert, oriented, appears well and in no acute distress  HEENT: atraumatic, conjunttiva clear, no obvious abnormalities on inspection of external nose and ears  NECK: normal movements of the head and neck  LUNGS: on inspection no signs of respiratory distress, breathing rate appears normal, no obvious gross SOB, gasping or wheezing  CV: no obvious cyanosis  MS: moves all visible extremities without noticeable abnormality  PSYCH/NEURO: pleasant and cooperative, no obvious depression or anxiety, speech and thought processing grossly intact  ASSESSMENT AND PLAN:  Discussed the following assessment and plan:  Nasal congestion  Cough  -we discussed possible serious and likely etiologies, options for evaluation and workup, limitations of telemedicine visit vs in person visit, treatment, treatment risks and precautions. Pt prefers to treat via telemedicine empirically rather than in person at this moment.  Query viral upper respiratory illness most likely, versus seasonal allergies versus other.  Discussed possibility of mild influenza, however felt that was probably less likely given no fever or more severe symptoms.  She opted for treatment with Tessalon for cough, nasal saline and restarting her allergy pill as she does tend to get allergy issues this time a year. Work/School slipped offered: declined Scheduled follow up with PCP offered: She agrees to schedule follow-up if needed. Advised to seek prompt in person care if worsening, new symptoms arise, or if  is not improving with treatment. Discussed options for inperson care if PCP office not available. Did let this patient know that I only do telemedicine on Tuesdays and Thursdays for [facility]. Advised to schedule follow up visit with PCP or [facility] if any further questions or concerns to avoid delays in care.    I discussed the assessment and treatment plan with the patient. The patient was provided an opportunity to ask questions and all were answered. The patient agreed with the plan and demonstrated an understanding of the instructions.     Lucretia Kern, DO

## 2021-01-17 DIAGNOSIS — M531 Cervicobrachial syndrome: Secondary | ICD-10-CM | POA: Diagnosis not present

## 2021-01-17 DIAGNOSIS — M9901 Segmental and somatic dysfunction of cervical region: Secondary | ICD-10-CM | POA: Diagnosis not present

## 2021-01-17 DIAGNOSIS — M9905 Segmental and somatic dysfunction of pelvic region: Secondary | ICD-10-CM | POA: Diagnosis not present

## 2021-01-17 DIAGNOSIS — M9904 Segmental and somatic dysfunction of sacral region: Secondary | ICD-10-CM | POA: Diagnosis not present

## 2021-01-22 DIAGNOSIS — M9905 Segmental and somatic dysfunction of pelvic region: Secondary | ICD-10-CM | POA: Diagnosis not present

## 2021-01-22 DIAGNOSIS — M531 Cervicobrachial syndrome: Secondary | ICD-10-CM | POA: Diagnosis not present

## 2021-01-22 DIAGNOSIS — M9904 Segmental and somatic dysfunction of sacral region: Secondary | ICD-10-CM | POA: Diagnosis not present

## 2021-01-22 DIAGNOSIS — M9901 Segmental and somatic dysfunction of cervical region: Secondary | ICD-10-CM | POA: Diagnosis not present

## 2021-01-29 DIAGNOSIS — M9901 Segmental and somatic dysfunction of cervical region: Secondary | ICD-10-CM | POA: Diagnosis not present

## 2021-01-29 DIAGNOSIS — M531 Cervicobrachial syndrome: Secondary | ICD-10-CM | POA: Diagnosis not present

## 2021-01-29 DIAGNOSIS — M9905 Segmental and somatic dysfunction of pelvic region: Secondary | ICD-10-CM | POA: Diagnosis not present

## 2021-01-29 DIAGNOSIS — M9904 Segmental and somatic dysfunction of sacral region: Secondary | ICD-10-CM | POA: Diagnosis not present

## 2021-02-07 DIAGNOSIS — M9901 Segmental and somatic dysfunction of cervical region: Secondary | ICD-10-CM | POA: Diagnosis not present

## 2021-02-07 DIAGNOSIS — M9904 Segmental and somatic dysfunction of sacral region: Secondary | ICD-10-CM | POA: Diagnosis not present

## 2021-02-07 DIAGNOSIS — M531 Cervicobrachial syndrome: Secondary | ICD-10-CM | POA: Diagnosis not present

## 2021-02-07 DIAGNOSIS — M9905 Segmental and somatic dysfunction of pelvic region: Secondary | ICD-10-CM | POA: Diagnosis not present

## 2021-02-12 DIAGNOSIS — M9904 Segmental and somatic dysfunction of sacral region: Secondary | ICD-10-CM | POA: Diagnosis not present

## 2021-02-12 DIAGNOSIS — M531 Cervicobrachial syndrome: Secondary | ICD-10-CM | POA: Diagnosis not present

## 2021-02-12 DIAGNOSIS — M9905 Segmental and somatic dysfunction of pelvic region: Secondary | ICD-10-CM | POA: Diagnosis not present

## 2021-02-12 DIAGNOSIS — M9901 Segmental and somatic dysfunction of cervical region: Secondary | ICD-10-CM | POA: Diagnosis not present

## 2021-02-18 DIAGNOSIS — M9901 Segmental and somatic dysfunction of cervical region: Secondary | ICD-10-CM | POA: Diagnosis not present

## 2021-02-18 DIAGNOSIS — M9904 Segmental and somatic dysfunction of sacral region: Secondary | ICD-10-CM | POA: Diagnosis not present

## 2021-02-18 DIAGNOSIS — M531 Cervicobrachial syndrome: Secondary | ICD-10-CM | POA: Diagnosis not present

## 2021-02-18 DIAGNOSIS — M9905 Segmental and somatic dysfunction of pelvic region: Secondary | ICD-10-CM | POA: Diagnosis not present

## 2021-02-25 DIAGNOSIS — M9905 Segmental and somatic dysfunction of pelvic region: Secondary | ICD-10-CM | POA: Diagnosis not present

## 2021-02-25 DIAGNOSIS — M531 Cervicobrachial syndrome: Secondary | ICD-10-CM | POA: Diagnosis not present

## 2021-02-25 DIAGNOSIS — M9901 Segmental and somatic dysfunction of cervical region: Secondary | ICD-10-CM | POA: Diagnosis not present

## 2021-02-25 DIAGNOSIS — M9904 Segmental and somatic dysfunction of sacral region: Secondary | ICD-10-CM | POA: Diagnosis not present

## 2021-03-05 DIAGNOSIS — M531 Cervicobrachial syndrome: Secondary | ICD-10-CM | POA: Diagnosis not present

## 2021-03-05 DIAGNOSIS — M9905 Segmental and somatic dysfunction of pelvic region: Secondary | ICD-10-CM | POA: Diagnosis not present

## 2021-03-05 DIAGNOSIS — M9901 Segmental and somatic dysfunction of cervical region: Secondary | ICD-10-CM | POA: Diagnosis not present

## 2021-03-05 DIAGNOSIS — M9904 Segmental and somatic dysfunction of sacral region: Secondary | ICD-10-CM | POA: Diagnosis not present

## 2021-03-11 DIAGNOSIS — M9905 Segmental and somatic dysfunction of pelvic region: Secondary | ICD-10-CM | POA: Diagnosis not present

## 2021-03-11 DIAGNOSIS — M531 Cervicobrachial syndrome: Secondary | ICD-10-CM | POA: Diagnosis not present

## 2021-03-11 DIAGNOSIS — M9904 Segmental and somatic dysfunction of sacral region: Secondary | ICD-10-CM | POA: Diagnosis not present

## 2021-03-11 DIAGNOSIS — M9901 Segmental and somatic dysfunction of cervical region: Secondary | ICD-10-CM | POA: Diagnosis not present

## 2021-03-18 DIAGNOSIS — M9901 Segmental and somatic dysfunction of cervical region: Secondary | ICD-10-CM | POA: Diagnosis not present

## 2021-03-18 DIAGNOSIS — M9905 Segmental and somatic dysfunction of pelvic region: Secondary | ICD-10-CM | POA: Diagnosis not present

## 2021-03-18 DIAGNOSIS — M9904 Segmental and somatic dysfunction of sacral region: Secondary | ICD-10-CM | POA: Diagnosis not present

## 2021-03-18 DIAGNOSIS — M531 Cervicobrachial syndrome: Secondary | ICD-10-CM | POA: Diagnosis not present

## 2021-04-01 DIAGNOSIS — M9905 Segmental and somatic dysfunction of pelvic region: Secondary | ICD-10-CM | POA: Diagnosis not present

## 2021-04-01 DIAGNOSIS — M531 Cervicobrachial syndrome: Secondary | ICD-10-CM | POA: Diagnosis not present

## 2021-04-01 DIAGNOSIS — M9904 Segmental and somatic dysfunction of sacral region: Secondary | ICD-10-CM | POA: Diagnosis not present

## 2021-04-01 DIAGNOSIS — M9901 Segmental and somatic dysfunction of cervical region: Secondary | ICD-10-CM | POA: Diagnosis not present

## 2021-04-22 DIAGNOSIS — M9905 Segmental and somatic dysfunction of pelvic region: Secondary | ICD-10-CM | POA: Diagnosis not present

## 2021-04-22 DIAGNOSIS — M9901 Segmental and somatic dysfunction of cervical region: Secondary | ICD-10-CM | POA: Diagnosis not present

## 2021-04-22 DIAGNOSIS — M531 Cervicobrachial syndrome: Secondary | ICD-10-CM | POA: Diagnosis not present

## 2021-04-22 DIAGNOSIS — M9904 Segmental and somatic dysfunction of sacral region: Secondary | ICD-10-CM | POA: Diagnosis not present

## 2021-05-13 DIAGNOSIS — M9901 Segmental and somatic dysfunction of cervical region: Secondary | ICD-10-CM | POA: Diagnosis not present

## 2021-05-13 DIAGNOSIS — M9904 Segmental and somatic dysfunction of sacral region: Secondary | ICD-10-CM | POA: Diagnosis not present

## 2021-05-13 DIAGNOSIS — M531 Cervicobrachial syndrome: Secondary | ICD-10-CM | POA: Diagnosis not present

## 2021-05-13 DIAGNOSIS — M9905 Segmental and somatic dysfunction of pelvic region: Secondary | ICD-10-CM | POA: Diagnosis not present

## 2021-05-20 DIAGNOSIS — M9901 Segmental and somatic dysfunction of cervical region: Secondary | ICD-10-CM | POA: Diagnosis not present

## 2021-05-20 DIAGNOSIS — M9905 Segmental and somatic dysfunction of pelvic region: Secondary | ICD-10-CM | POA: Diagnosis not present

## 2021-05-20 DIAGNOSIS — M9904 Segmental and somatic dysfunction of sacral region: Secondary | ICD-10-CM | POA: Diagnosis not present

## 2021-05-20 DIAGNOSIS — M531 Cervicobrachial syndrome: Secondary | ICD-10-CM | POA: Diagnosis not present

## 2021-05-22 ENCOUNTER — Encounter: Payer: Self-pay | Admitting: Family Medicine

## 2021-05-22 ENCOUNTER — Telehealth (INDEPENDENT_AMBULATORY_CARE_PROVIDER_SITE_OTHER): Payer: Medicare PPO | Admitting: Family Medicine

## 2021-05-22 DIAGNOSIS — R531 Weakness: Secondary | ICD-10-CM | POA: Diagnosis not present

## 2021-05-22 DIAGNOSIS — M542 Cervicalgia: Secondary | ICD-10-CM | POA: Diagnosis not present

## 2021-05-22 DIAGNOSIS — R202 Paresthesia of skin: Secondary | ICD-10-CM | POA: Diagnosis not present

## 2021-05-22 NOTE — Progress Notes (Signed)
Virtual Visit via Telephone Note  I connected with Teandrea Cubero Ogas on 05/22/21 at 10:40 AM EDT by telephone and verified that I am speaking with the correct person using two identifiers.   I discussed the limitations, risks, security and privacy concerns of performing an evaluation and management service by telephone and the availability of in person appointments. I also discussed with the patient that there may be a patient responsible charge related to this service. The patient expressed understanding and agreed to proceed.  Location patient: home, [place] Location provider: work or home office Participants present for the call: patient, provider Patient did not have a visit with me in the prior 7 days to address this/these issue(s).   History of Present Illness:  Acute telemedicine visit for paresthesias and neck/shoulder pain: -Onset: 4-5 days ago -Symptoms include: tingling pain in the 4th-5th digits and all the way up the arm, into the shoulder and up into the neck with severe pain (9/10) in the neck on the L side, feels some weakness in the L arm as well, not improving -had a touch of neck pain a few months ago and saw a chiropractor -Denies:fevers, headache, CP, SOB, malaise -Has tried:otc analgesics are not helping at all -Pertinent past medical history: see below -Pertinent medication allergies: Allergies  Allergen Reactions   Clindamycin Rash   Erythromycin Base Rash   Levofloxacin Rash   Past Medical History:  Diagnosis Date   Allergy    Anemia    Arthritis    Asthma    Blood transfusion without reported diagnosis 2020   Fever blister    Heart murmur    History of colon polyps    Osteoporosis        Observations/Objective: Patient sounds cheerful and well on the phone. I do not appreciate any SOB. Speech and thought processing are grossly intact. Patient reported vitals:  Assessment and Plan:  Neck pain  Paresthesia  Weakness  -we discussed possible  serious and likely etiologies, options for evaluation and workup, limitations of telemedicine visit vs in person visit, treatment, treatment risks and precautions. Advised given the severity and concerning nature of her symptoms she needs prompt inperson evaluation at a higher level of care. She preferred to see PCP, but they did not have openings. Discussed other options for care today and she prefers to try the orthopedic urgent care and agrees to go right away. Prefers to go by private vehicle and report her husband will drive her.    Follow Up Instructions:  I did not refer this patient for an OV with me in the next 24 hours for this/these issue(s).  I discussed the assessment and treatment plan with the patient. The patient was provided an opportunity to ask questions and all were answered. The patient agreed with the plan and demonstrated an understanding of the instructions.   I spent 18 minutes on the date of this visit in the care of this patient. See summary of tasks completed to properly care for this patient in the detailed notes above which also included counseling of above, review of PMH, medications, allergies, evaluation of the patient and ordering and/or  instructing patient on testing and care options.     Lucretia Kern, DO

## 2021-05-27 DIAGNOSIS — M9901 Segmental and somatic dysfunction of cervical region: Secondary | ICD-10-CM | POA: Diagnosis not present

## 2021-05-27 DIAGNOSIS — M9904 Segmental and somatic dysfunction of sacral region: Secondary | ICD-10-CM | POA: Diagnosis not present

## 2021-05-27 DIAGNOSIS — M531 Cervicobrachial syndrome: Secondary | ICD-10-CM | POA: Diagnosis not present

## 2021-05-27 DIAGNOSIS — M9905 Segmental and somatic dysfunction of pelvic region: Secondary | ICD-10-CM | POA: Diagnosis not present

## 2021-06-02 DIAGNOSIS — M542 Cervicalgia: Secondary | ICD-10-CM | POA: Diagnosis not present

## 2021-06-02 DIAGNOSIS — M25512 Pain in left shoulder: Secondary | ICD-10-CM | POA: Diagnosis not present

## 2021-06-04 DIAGNOSIS — M9905 Segmental and somatic dysfunction of pelvic region: Secondary | ICD-10-CM | POA: Diagnosis not present

## 2021-06-04 DIAGNOSIS — M531 Cervicobrachial syndrome: Secondary | ICD-10-CM | POA: Diagnosis not present

## 2021-06-04 DIAGNOSIS — M9904 Segmental and somatic dysfunction of sacral region: Secondary | ICD-10-CM | POA: Diagnosis not present

## 2021-06-04 DIAGNOSIS — M9901 Segmental and somatic dysfunction of cervical region: Secondary | ICD-10-CM | POA: Diagnosis not present

## 2021-06-16 ENCOUNTER — Ambulatory Visit: Payer: Medicare PPO | Admitting: Dermatology

## 2021-06-19 DIAGNOSIS — K648 Other hemorrhoids: Secondary | ICD-10-CM | POA: Diagnosis not present

## 2021-06-25 DIAGNOSIS — M9904 Segmental and somatic dysfunction of sacral region: Secondary | ICD-10-CM | POA: Diagnosis not present

## 2021-06-25 DIAGNOSIS — M9905 Segmental and somatic dysfunction of pelvic region: Secondary | ICD-10-CM | POA: Diagnosis not present

## 2021-06-25 DIAGNOSIS — M9901 Segmental and somatic dysfunction of cervical region: Secondary | ICD-10-CM | POA: Diagnosis not present

## 2021-06-25 DIAGNOSIS — M531 Cervicobrachial syndrome: Secondary | ICD-10-CM | POA: Diagnosis not present

## 2021-07-09 DIAGNOSIS — M9904 Segmental and somatic dysfunction of sacral region: Secondary | ICD-10-CM | POA: Diagnosis not present

## 2021-07-09 DIAGNOSIS — M9905 Segmental and somatic dysfunction of pelvic region: Secondary | ICD-10-CM | POA: Diagnosis not present

## 2021-07-09 DIAGNOSIS — M531 Cervicobrachial syndrome: Secondary | ICD-10-CM | POA: Diagnosis not present

## 2021-07-09 DIAGNOSIS — M9901 Segmental and somatic dysfunction of cervical region: Secondary | ICD-10-CM | POA: Diagnosis not present

## 2021-07-23 DIAGNOSIS — M9901 Segmental and somatic dysfunction of cervical region: Secondary | ICD-10-CM | POA: Diagnosis not present

## 2021-07-23 DIAGNOSIS — M9905 Segmental and somatic dysfunction of pelvic region: Secondary | ICD-10-CM | POA: Diagnosis not present

## 2021-07-23 DIAGNOSIS — M9904 Segmental and somatic dysfunction of sacral region: Secondary | ICD-10-CM | POA: Diagnosis not present

## 2021-07-23 DIAGNOSIS — M531 Cervicobrachial syndrome: Secondary | ICD-10-CM | POA: Diagnosis not present

## 2021-08-04 ENCOUNTER — Encounter: Payer: Self-pay | Admitting: Dermatology

## 2021-08-04 ENCOUNTER — Other Ambulatory Visit: Payer: Self-pay

## 2021-08-04 ENCOUNTER — Ambulatory Visit: Payer: Medicare PPO | Admitting: Dermatology

## 2021-08-04 DIAGNOSIS — Z1283 Encounter for screening for malignant neoplasm of skin: Secondary | ICD-10-CM | POA: Diagnosis not present

## 2021-08-04 DIAGNOSIS — D1801 Hemangioma of skin and subcutaneous tissue: Secondary | ICD-10-CM

## 2021-08-04 DIAGNOSIS — D2371 Other benign neoplasm of skin of right lower limb, including hip: Secondary | ICD-10-CM | POA: Diagnosis not present

## 2021-08-04 DIAGNOSIS — L821 Other seborrheic keratosis: Secondary | ICD-10-CM | POA: Diagnosis not present

## 2021-08-04 DIAGNOSIS — D2361 Other benign neoplasm of skin of right upper limb, including shoulder: Secondary | ICD-10-CM | POA: Diagnosis not present

## 2021-08-04 DIAGNOSIS — Z808 Family history of malignant neoplasm of other organs or systems: Secondary | ICD-10-CM

## 2021-08-04 NOTE — Patient Instructions (Signed)
Get OTC Neutrogena rapid clear

## 2021-08-14 ENCOUNTER — Other Ambulatory Visit: Payer: Self-pay

## 2021-08-14 ENCOUNTER — Encounter: Payer: Self-pay | Admitting: Family Medicine

## 2021-08-14 ENCOUNTER — Ambulatory Visit: Payer: Medicare PPO | Admitting: Family Medicine

## 2021-08-14 VITALS — BP 126/74 | HR 61 | Temp 98.0°F | Resp 16 | Ht 65.5 in | Wt 142.8 lb

## 2021-08-14 DIAGNOSIS — Z23 Encounter for immunization: Secondary | ICD-10-CM | POA: Diagnosis not present

## 2021-08-14 DIAGNOSIS — N390 Urinary tract infection, site not specified: Secondary | ICD-10-CM

## 2021-08-14 DIAGNOSIS — R350 Frequency of micturition: Secondary | ICD-10-CM | POA: Diagnosis not present

## 2021-08-14 DIAGNOSIS — J302 Other seasonal allergic rhinitis: Secondary | ICD-10-CM

## 2021-08-14 DIAGNOSIS — M810 Age-related osteoporosis without current pathological fracture: Secondary | ICD-10-CM

## 2021-08-14 DIAGNOSIS — Z1231 Encounter for screening mammogram for malignant neoplasm of breast: Secondary | ICD-10-CM | POA: Diagnosis not present

## 2021-08-14 DIAGNOSIS — R319 Hematuria, unspecified: Secondary | ICD-10-CM

## 2021-08-14 LAB — POCT URINALYSIS DIP (MANUAL ENTRY)
Bilirubin, UA: NEGATIVE
Glucose, UA: NEGATIVE mg/dL
Ketones, POC UA: NEGATIVE mg/dL
Nitrite, UA: NEGATIVE
Spec Grav, UA: 1.025 (ref 1.010–1.025)
Urobilinogen, UA: 0.2 E.U./dL
pH, UA: 6 (ref 5.0–8.0)

## 2021-08-14 MED ORDER — CEPHALEXIN 500 MG PO CAPS: 500.0000 mg | ORAL_CAPSULE | Freq: Two times a day (BID) | ORAL | 0 refills | Status: DC

## 2021-08-14 MED ORDER — FLUTICASONE PROPIONATE 50 MCG/ACT NA SUSP: 1.0000 | Freq: Every day | NASAL | 6 refills | Status: DC

## 2021-08-14 NOTE — Patient Instructions (Signed)
I will check a urine culture but antibiotic prescribed today should help with the urinary tract infection.  Follow-up if any worsening symptoms or new symptoms.  Try fluticasone nasal spray in addition to Zyrtec for allergies.  Follow-up with that if not improving.  I will order the mammogram and bone density testing.  Follow-up in 3 months for wellness exam.  Let me know if there are questions sooner.  Thanks for coming in today.  Urinary Tract Infection, Adult A urinary tract infection (UTI) is an infection of any part of the urinary tract. The urinary tract includes the kidneys, ureters, bladder, and urethra. These organs make, store, and get rid of urine in the body. An upper UTI affects the ureters and kidneys. A lower UTI affects the bladder and urethra. What are the causes? Most urinary tract infections are caused by bacteria in your genital area around your urethra, where urine leaves your body. These bacteria grow and cause inflammation of your urinary tract. What increases the risk? You are more likely to develop this condition if: You have a urinary catheter that stays in place. You are not able to control when you urinate or have a bowel movement (incontinence). You are female and you: Use a spermicide or diaphragm for birth control. Have low estrogen levels. Are pregnant. You have certain genes that increase your risk. You are sexually active. You take antibiotic medicines. You have a condition that causes your flow of urine to slow down, such as: An enlarged prostate, if you are female. Blockage in your urethra. A kidney stone. A nerve condition that affects your bladder control (neurogenic bladder). Not getting enough to drink, or not urinating often. You have certain medical conditions, such as: Diabetes. A weak disease-fighting system (immunesystem). Sickle cell disease. Gout. Spinal cord injury. What are the signs or symptoms? Symptoms of this condition  include: Needing to urinate right away (urgency). Frequent urination. This may include small amounts of urine each time you urinate. Pain or burning with urination. Blood in the urine. Urine that smells bad or unusual. Trouble urinating. Cloudy urine. Vaginal discharge, if you are female. Pain in the abdomen or the lower back. You may also have: Vomiting or a decreased appetite. Confusion. Irritability or tiredness. A fever or chills. Diarrhea. The first symptom in older adults may be confusion. In some cases, they may not have any symptoms until the infection has worsened. How is this diagnosed? This condition is diagnosed based on your medical history and a physical exam. You may also have other tests, including: Urine tests. Blood tests. Tests for STIs (sexually transmitted infections). If you have had more than one UTI, a cystoscopy or imaging studies may be done to determine the cause of the infections. How is this treated? Treatment for this condition includes: Antibiotic medicine. Over-the-counter medicines to treat discomfort. Drinking enough water to stay hydrated. If you have frequent infections or have other conditions such as a kidney stone, you may need to see a health care provider who specializes in the urinary tract (urologist). In rare cases, urinary tract infections can cause sepsis. Sepsis is a life-threatening condition that occurs when the body responds to an infection. Sepsis is treated in the hospital with IV antibiotics, fluids, and other medicines. Follow these instructions at home: Medicines Take over-the-counter and prescription medicines only as told by your health care provider. If you were prescribed an antibiotic medicine, take it as told by your health care provider. Do not stop using the antibiotic  even if you start to feel better. General instructions Make sure you: Empty your bladder often and completely. Do not hold urine for long periods of  time. Empty your bladder after sex. Wipe from front to back after urinating or having a bowel movement if you are female. Use each tissue only one time when you wipe. Drink enough fluid to keep your urine pale yellow. Keep all follow-up visits. This is important. Contact a health care provider if: Your symptoms do not get better after 1-2 days. Your symptoms go away and then return. Get help right away if: You have severe pain in your back or your lower abdomen. You have a fever or chills. You have nausea or vomiting. Summary A urinary tract infection (UTI) is an infection of any part of the urinary tract, which includes the kidneys, ureters, bladder, and urethra. Most urinary tract infections are caused by bacteria in your genital area. Treatment for this condition often includes antibiotic medicines. If you were prescribed an antibiotic medicine, take it as told by your health care provider. Do not stop using the antibiotic even if you start to feel better. Keep all follow-up visits. This is important. This information is not intended to replace advice given to you by your health care provider. Make sure you discuss any questions you have with your health care provider. Document Revised: 05/17/2020 Document Reviewed: 05/17/2020 Elsevier Patient Education  [place].

## 2021-08-14 NOTE — Progress Notes (Signed)
Subjective:  Patient ID: Sylvia Bowen, female    DOB: 05/13/1946  Age: 75 y.o. MRN: [id number]  CC:  Chief Complaint  Patient presents with   Establish Care    Pt here for initial establish of care, no concerns no questions.    Urinary Frequency    Pt reports burning and urgency and frequency, has been present for 2 weeks will take Azo. Reports somewhat better today     HPI Sylvia Bowen presents for   New patient to establish care, previous primary care provider Elyn Aquas, PA-C.   Per problem list history of cataracts, osteoporosis, herpes labialis, seasonal allergies, heart murmur, colon polyps, leukopenia.  Normal WBC on last testing.  No prescription meds.  Osteoporosis: Treated with Ca, Vit D. Last test when lived in [place]. [place] 09/06/19 - unable to see report.  Taking Calcium and Vit D otc - unknown amount in supplement.  Exercise at [facility] Chair yoga.  Lab Results  Component Value Date   WBC 4.2 09/03/2020   HGB 13.9 09/03/2020   HCT 41.2 09/03/2020   MCV 92.1 09/03/2020   PLT 235.0 09/03/2020   Urinary frequency Past 2 weeks with some associated burning. Has treated with Azo in past,  Improving recently - today, but still nocturia x 3 last night.  Home treatments: Azo. Occasional cranberry.  No f/c/n/v/abd pain or back pain.   Seasonal Allergies: Worse this season. Taking Zyrtec - min relief. No recent nasal spray.   Health maintenance Mammogram: last one 2 years ago - agrees to referral, always normal.  Flu vaccine: today.  COVID-19 vaccine: [employer] vaccine in April 2021, booster - none. Declines. Had covid infection in April.   History Patient Active Problem List   Diagnosis Date Noted   Seasonal allergies 09/03/2020   Fever blister    History of colon polyps    Heart murmur    Leukopenia 08/14/2019   Recurrent herpes labialis 10/21/2017   Osteoporosis 11/09/2016   Cortical age-related cataract of both eyes 10/29/2016    Past Medical History:  Diagnosis Date   Allergy    Anemia    Arthritis    Asthma    Blood transfusion without reported diagnosis 2020   Fever blister    Heart murmur    History of colon polyps    Osteoporosis    Past Surgical History:  Procedure Laterality Date   ABDOMINAL HYSTERECTOMY  2012   DILATION AND CURETTAGE OF UTERUS  1983   KNEE ARTHROSCOPY  2015   Allergies  Allergen Reactions   Clindamycin Rash   Erythromycin Base Rash   Levofloxacin Rash   Prior to Admission medications   Medication Sig Start Date End Date Taking? Authorizing Provider  CALCIUM PO Take by mouth.   Yes [provider]  Cholecalciferol 25 MCG (1000 UT) tablet Take by mouth.   Yes [provider]  Multiple Vitamins-Minerals (MULTIVITAMIN ADULTS PO) Take by mouth.   Yes [provider]  TURMERIC PO Take by mouth.   Yes [provider]  valACYclovir (VALTREX) 1000 MG tablet Take 1 tablet (1,000 mg total) by mouth 2 (two) times daily. 09/03/20  Yes Brunetta Jeans, PA-C  VITAMIN D PO Take by mouth. Patient not taking: Reported on 08/14/2021    [provider]   Social History   Socioeconomic History   Marital status: Married    Spouse name: Not on file   Number of children: Not on  file   Years of education: Not on file   Highest education level: Not on file  Occupational History   Not on file  Tobacco Use   Smoking status: Former    Types: Cigarettes   Smokeless tobacco: Never  Vaping Use   Vaping Use: Never used  Substance and Sexual Activity   Alcohol use: Not Currently   Drug use: Never   Sexual activity: Not Currently    Birth control/protection: Surgical  Other Topics Concern   Not on file  Social History Narrative   Not on file   Social Determinants of Health   Financial Resource Strain: Low Risk    Difficulty of Paying Living Expenses: Not hard at all  Food Insecurity: No Food Insecurity   Worried About Charity fundraiser  in the Last Year: Never true   [place] in the Last Year: Never true  Transportation Needs: No Transportation Needs   Lack of Transportation (Medical): No   Lack of Transportation (Non-Medical): No  Physical Activity: Inactive   Days of Exercise per Week: 0 days   Minutes of Exercise per Session: 0 min  Stress: No Stress Concern Present   Feeling of Stress : Not at all  Social Connections: Moderately Integrated   Frequency of Communication with Friends and Family: More than three times a week   Frequency of Social Gatherings with Friends and Family: More than three times a week   Attends Religious Services: More than 4 times per year   Active Member of [employer] or Organizations: No   Attends Archivist Meetings: Never   Marital Status: Married  Human resources officer Violence: Not At Risk   Fear of Current or Ex-Partner: No   Emotionally Abused: No   Physically Abused: No   Sexually Abused: No    Review of Systems Per HPI.    Objective:   Vitals:   08/14/21 0934  BP: 126/74  Pulse: 61  Resp: 16  Temp: 98 F (36.7 C)  TempSrc: Temporal  SpO2: 97%  Weight: 142 lb 12.8 oz (64.8 kg)  Height: 5' 5.5" (1.664 m)     Physical Exam Vitals reviewed.  Constitutional:      Appearance: Normal appearance. She is well-developed.  HENT:     Head: Normocephalic and atraumatic.  Eyes:     Conjunctiva/sclera: Conjunctivae normal.     Pupils: Pupils are equal, round, and reactive to light.  Neck:     Vascular: No carotid bruit.  Cardiovascular:     Rate and Rhythm: Normal rate and regular rhythm.     Heart sounds: Normal heart sounds.  Pulmonary:     Effort: Pulmonary effort is normal.     Breath sounds: Normal breath sounds.  Abdominal:     General: There is no distension.     Palpations: Abdomen is soft. There is no pulsatile mass.     Tenderness: There is abdominal tenderness (minimal suprapubic.). There is no right CVA tenderness, left CVA tenderness, guarding  or rebound.  Musculoskeletal:     Right lower leg: No edema.     Left lower leg: No edema.  Skin:    General: Skin is warm and dry.  Neurological:     Mental Status: She is alert and oriented to person, place, and time.  Psychiatric:        Mood and Affect: Mood normal.        Behavior: Behavior normal.     Results for orders  placed or performed in visit on 08/14/21  POCT urinalysis dipstick  Result Value Ref Range   Color, UA yellow yellow   Clarity, UA clear clear   Glucose, UA negative negative mg/dL   Bilirubin, UA negative negative   Ketones, POC UA negative negative mg/dL   Spec Grav, UA 1.025 1.010 - 1.025   Blood, UA trace-intact (A) negative   pH, UA 6.0 5.0 - 8.0   Protein Ur, POC trace (A) negative mg/dL   Urobilinogen, UA 0.2 0.2 or 1.0 E.U./dL   Nitrite, UA Negative Negative   Leukocytes, UA Trace (A) Negative     Assessment & Plan:  Sylvia Bowen is a 75 y.o. female . Frequent urination - Plan: POCT urinalysis dipstick, cephALEXin (KEFLEX) 500 MG capsule, Urine Culture Urinary tract infection with hematuria, site unspecified - Plan: cephALEXin (KEFLEX) 500 MG capsule, Urine Culture  -Suspected UTI with some improvement in symptoms, start Keflex, check urine culture.  Potential side effects and risks of meds discussed.  Encounter for screening mammogram for malignant neoplasm of breast - Plan: MM Digital Screening Osteoporosis, unspecified osteoporosis type, unspecified pathological fracture presence - Plan: DG Bone Density  -Continue calcium, vitamin D supplement for osteoporosis, updated bone density ordered in November.  Also due for updated mammogram, ordered in November.  Flu vaccine need - Plan: Flu Vaccine QUAD High Dose(Fluad)  Seasonal allergies - Plan: fluticasone (FLONASE) 50 MCG/ACT nasal spray  -Recommended adding Flonase, with correct technique discussed.  RTC precautions. Meds ordered this encounter  Medications   fluticasone (FLONASE) 50  MCG/ACT nasal spray    Sig: Place 1-2 sprays into both nostrils daily.    Dispense:  16 g    Refill:  6   cephALEXin (KEFLEX) 500 MG capsule    Sig: Take 1 capsule (500 mg total) by mouth 2 (two) times daily.    Dispense:  14 capsule    Refill:  0   Patient Instructions  I will check a urine culture but antibiotic prescribed today should help with the urinary tract infection.  Follow-up if any worsening symptoms or new symptoms.  Try fluticasone nasal spray in addition to Zyrtec for allergies.  Follow-up with that if not improving.  I will order the mammogram and bone density testing.  Follow-up in 3 months for wellness exam.  Let me know if there are questions sooner.  Thanks for coming in today.  Urinary Tract Infection, Adult A urinary tract infection (UTI) is an infection of any part of the urinary tract. The urinary tract includes the kidneys, ureters, bladder, and urethra. These organs make, store, and get rid of urine in the body. An upper UTI affects the ureters and kidneys. A lower UTI affects the bladder and urethra. What are the causes? Most urinary tract infections are caused by bacteria in your genital area around your urethra, where urine leaves your body. These bacteria grow and cause inflammation of your urinary tract. What increases the risk? You are more likely to develop this condition if: You have a urinary catheter that stays in place. You are not able to control when you urinate or have a bowel movement (incontinence). You are female and you: Use a spermicide or diaphragm for birth control. Have low estrogen levels. Are pregnant. You have certain genes that increase your risk. You are sexually active. You take antibiotic medicines. You have a condition that causes your flow of urine to slow down, such as: An enlarged prostate, if you are female.  Blockage in your urethra. A kidney stone. A nerve condition that affects your bladder control (neurogenic  bladder). Not getting enough to drink, or not urinating often. You have certain medical conditions, such as: Diabetes. A weak disease-fighting system (immunesystem). Sickle cell disease. Gout. Spinal cord injury. What are the signs or symptoms? Symptoms of this condition include: Needing to urinate right away (urgency). Frequent urination. This may include small amounts of urine each time you urinate. Pain or burning with urination. Blood in the urine. Urine that smells bad or unusual. Trouble urinating. Cloudy urine. Vaginal discharge, if you are female. Pain in the abdomen or the lower back. You may also have: Vomiting or a decreased appetite. Confusion. Irritability or tiredness. A fever or chills. Diarrhea. The first symptom in older adults may be confusion. In some cases, they may not have any symptoms until the infection has worsened. How is this diagnosed? This condition is diagnosed based on your medical history and a physical exam. You may also have other tests, including: Urine tests. Blood tests. Tests for STIs (sexually transmitted infections). If you have had more than one UTI, a cystoscopy or imaging studies may be done to determine the cause of the infections. How is this treated? Treatment for this condition includes: Antibiotic medicine. Over-the-counter medicines to treat discomfort. Drinking enough water to stay hydrated. If you have frequent infections or have other conditions such as a kidney stone, you may need to see a health care provider who specializes in the urinary tract (urologist). In rare cases, urinary tract infections can cause sepsis. Sepsis is a life-threatening condition that occurs when the body responds to an infection. Sepsis is treated in the hospital with IV antibiotics, fluids, and other medicines. Follow these instructions at home: Medicines Take over-the-counter and prescription medicines only as told by your health care  provider. If you were prescribed an antibiotic medicine, take it as told by your health care provider. Do not stop using the antibiotic even if you start to feel better. General instructions Make sure you: Empty your bladder often and completely. Do not hold urine for long periods of time. Empty your bladder after sex. Wipe from front to back after urinating or having a bowel movement if you are female. Use each tissue only one time when you wipe. Drink enough fluid to keep your urine pale yellow. Keep all follow-up visits. This is important. Contact a health care provider if: Your symptoms do not get better after 1-2 days. Your symptoms go away and then return. Get help right away if: You have severe pain in your back or your lower abdomen. You have a fever or chills. You have nausea or vomiting. Summary A urinary tract infection (UTI) is an infection of any part of the urinary tract, which includes the kidneys, ureters, bladder, and urethra. Most urinary tract infections are caused by bacteria in your genital area. Treatment for this condition often includes antibiotic medicines. If you were prescribed an antibiotic medicine, take it as told by your health care provider. Do not stop using the antibiotic even if you start to feel better. Keep all follow-up visits. This is important. This information is not intended to replace advice given to you by your health care provider. Make sure you discuss any questions you have with your health care provider. Document Revised: 05/17/2020 Document Reviewed: 05/17/2020 Elsevier Patient Education  2022 [employer].    Signed,   Merri Ray, MD  Primary Care, [facility]  08/14/21 10:24 AM

## 2021-08-15 LAB — URINE CULTURE
MICRO NUMBER:: 12560292
SPECIMEN QUALITY:: ADEQUATE

## 2021-08-18 ENCOUNTER — Encounter: Payer: Self-pay | Admitting: Dermatology

## 2021-08-18 NOTE — Progress Notes (Signed)
   New Patient   Subjective  Sylvia Bowen is a 75 y.o. female who presents for the following: New Patient (Initial Visit) (Patient here today for skin check, per patient she has a lesion on the left side of her nose and the right side of her nose x 9 months no bleeding. No personal history of atypical moles, melanoma or non mole skin cancer. Per patient family history of non mole skin cancer. ).  General skin examination, several areas to check Location:  Duration:  Quality:  Associated Signs/Symptoms: Modifying Factors:  Severity:  Timing: Context:    The following portions of the chart were reviewed this encounter and updated as appropriate:  Tobacco  Allergies  Meds  Problems  Med Hx  Surg Hx  Fam Hx      Objective  Well appearing patient in no apparent distress; mood and affect are within normal limits. Scalp Full body skin check right shin right breast keratoses also right wrist, right knee; Dermatofibroma, angiomas on the right and left leg and chest,   Right Breast, Right Lower Leg - Anterior Brown textured 7 mm flattopped papules    A full examination was performed including scalp, head, eyes, ears, nose, lips, neck, chest, axillae, abdomen, back, buttocks, bilateral upper extremities, bilateral lower extremities, hands, feet, fingers, toes, fingernails, and toenails. All findings within normal limits unless otherwise noted below.  Areas beneath undergarments not fully examined   Assessment & Plan  Screening exam for skin cancer Scalp  Keep yearly skin checks no atypia or skin cancer found today.  Continued sun protection  Seborrheic keratosis (2) Right Lower Leg - Anterior; Right Breast  Leave if stable

## 2021-09-24 DIAGNOSIS — M531 Cervicobrachial syndrome: Secondary | ICD-10-CM | POA: Diagnosis not present

## 2021-09-24 DIAGNOSIS — M9904 Segmental and somatic dysfunction of sacral region: Secondary | ICD-10-CM | POA: Diagnosis not present

## 2021-09-24 DIAGNOSIS — M9901 Segmental and somatic dysfunction of cervical region: Secondary | ICD-10-CM | POA: Diagnosis not present

## 2021-09-24 DIAGNOSIS — M9905 Segmental and somatic dysfunction of pelvic region: Secondary | ICD-10-CM | POA: Diagnosis not present

## 2021-10-01 DIAGNOSIS — M25512 Pain in left shoulder: Secondary | ICD-10-CM | POA: Diagnosis not present

## 2021-10-01 DIAGNOSIS — M542 Cervicalgia: Secondary | ICD-10-CM | POA: Diagnosis not present

## 2021-10-08 DIAGNOSIS — M9901 Segmental and somatic dysfunction of cervical region: Secondary | ICD-10-CM | POA: Diagnosis not present

## 2021-10-08 DIAGNOSIS — M9904 Segmental and somatic dysfunction of sacral region: Secondary | ICD-10-CM | POA: Diagnosis not present

## 2021-10-08 DIAGNOSIS — M531 Cervicobrachial syndrome: Secondary | ICD-10-CM | POA: Diagnosis not present

## 2021-10-08 DIAGNOSIS — M9905 Segmental and somatic dysfunction of pelvic region: Secondary | ICD-10-CM | POA: Diagnosis not present

## 2021-10-31 DIAGNOSIS — M9905 Segmental and somatic dysfunction of pelvic region: Secondary | ICD-10-CM | POA: Diagnosis not present

## 2021-10-31 DIAGNOSIS — M531 Cervicobrachial syndrome: Secondary | ICD-10-CM | POA: Diagnosis not present

## 2021-10-31 DIAGNOSIS — M9904 Segmental and somatic dysfunction of sacral region: Secondary | ICD-10-CM | POA: Diagnosis not present

## 2021-10-31 DIAGNOSIS — M9901 Segmental and somatic dysfunction of cervical region: Secondary | ICD-10-CM | POA: Diagnosis not present

## 2021-11-13 ENCOUNTER — Ambulatory Visit (INDEPENDENT_AMBULATORY_CARE_PROVIDER_SITE_OTHER): Payer: Medicare PPO

## 2021-11-13 VITALS — BP 132/68 | HR 66 | Temp 98.0°F | Ht 66.0 in | Wt 142.0 lb

## 2021-11-13 DIAGNOSIS — Z0000 Encounter for general adult medical examination without abnormal findings: Secondary | ICD-10-CM

## 2021-11-13 NOTE — Progress Notes (Signed)
Subjective:   Sylvia Bowen is a 76 y.o. female who presents for Medicare Annual (Subsequent) preventive examination.  Review of Systems     Cardiac Risk Factors include: advanced age (>39men, >31 women)     Objective:    Today's Vitals   11/13/21 0822  BP: 132/68  Pulse: 66  Temp: 98 F (36.7 C)  SpO2: 94%  Weight: 142 lb (64.4 kg)  Height: 5\' 6"  (1.676 m)   Body mass index is 22.92 kg/m.  Advanced Directives 11/13/2021 10/14/2020  Does Patient Have a Medical Advance Directive? No No  Would patient like information on creating a medical advance directive? No - Patient declined No - Patient declined    Current Medications (verified) Outpatient Encounter Medications as of 11/13/2021  Medication Sig   CALCIUM PO Take by mouth.   Cholecalciferol 25 MCG (1000 UT) tablet Take by mouth.   fluticasone (FLONASE) 50 MCG/ACT nasal spray Place 1-2 sprays into both nostrils daily.   Multiple Vitamins-Minerals (MULTIVITAMIN ADULTS PO) Take by mouth.   TURMERIC PO Take by mouth.   valACYclovir (VALTREX) 1000 MG tablet Take 1 tablet (1,000 mg total) by mouth 2 (two) times daily.   VITAMIN D PO Take by mouth.   cephALEXin (KEFLEX) 500 MG capsule Take 1 capsule (500 mg total) by mouth 2 (two) times daily. (Patient not taking: Reported on 11/13/2021)   No facility-administered encounter medications on file as of 11/13/2021.    Allergies (verified) Clindamycin, Erythromycin base, and Levofloxacin   History: Past Medical History:  Diagnosis Date   Allergy    Anemia    Arthritis    Asthma    Blood transfusion without reported diagnosis 2020   Fever blister    Heart murmur    History of colon polyps    Osteoporosis    Past Surgical History:  Procedure Laterality Date   ABDOMINAL HYSTERECTOMY  2012   DILATION AND CURETTAGE OF UTERUS  1983   KNEE ARTHROSCOPY  2015   Family History  Problem Relation Age of Onset   Arthritis Mother    Hearing loss Mother    Miscarriages /  [place] Mother    Stroke Mother    Cancer Father    Heart attack Father    Stroke Father    Stroke Maternal Grandmother    Stroke Maternal Grandfather    Stroke Paternal Grandmother    Stroke Paternal Grandfather    Arthritis Brother    Cancer Brother    Social History   Socioeconomic History   Marital status: Married    Spouse name: Not on file   Number of children: Not on file   Years of education: Not on file   Highest education level: Not on file  Occupational History   Not on file  Tobacco Use   Smoking status: Former    Types: Cigarettes   Smokeless tobacco: Never  Vaping Use   Vaping Use: Never used  Substance and Sexual Activity   Alcohol use: Not Currently   Drug use: Never   Sexual activity: Not Currently    Birth control/protection: Surgical  Other Topics Concern   Not on file  Social History Narrative   Not on file   Social Determinants of Health   Financial Resource Strain: Low Risk    Difficulty of Paying Living Expenses: Not hard at all  Food Insecurity: No Food Insecurity   Worried About Sylvia Bowen in the Last Year: Never true   Ran  Out of Food in the Last Year: Never true  Transportation Needs: No Transportation Needs   Lack of Transportation (Medical): No   Lack of Transportation (Non-Medical): No  Physical Activity: Insufficiently Active   Days of Exercise per Week: 5 days   Minutes of Exercise per Session: 20 min  Stress: No Stress Concern Present   Feeling of Stress : Not at all  Social Connections: Socially Integrated   Frequency of Communication with Friends and Family: Twice a week   Frequency of Social Gatherings with Friends and Family: Twice a week   Attends Religious Services: More than 4 times per year   Active Member of [employer] or Organizations: Yes   Attends Music therapist: More than 4 times per year   Marital Status: Married    Tobacco Counseling Counseling given: Not Answered   Clinical  Intake:  Pre-visit preparation completed: Yes  Pain : No/denies pain     Nutritional Risks: None Diabetes: No  How often do you need to have someone help you when you read instructions, pamphlets, or other written materials from your doctor or pharmacy?: 1 - Never What is the last grade level you completed in school?: masters  Diabetic?no   Interpreter Needed?: No  Information entered by :: L.Duran Ohern,LPN   Activities of Daily Living In your present state of health, do you have any difficulty performing the following activities: 11/13/2021  Hearing? N  Vision? N  Difficulty concentrating or making decisions? N  Walking or climbing stairs? N  Dressing or bathing? N  Doing errands, shopping? N  Preparing Food and eating ? N  Using the Toilet? N  In the past six months, have you accidently leaked urine? N  Do you have problems with loss of bowel control? N  Managing your Medications? N  Managing your Finances? N  Housekeeping or managing your Housekeeping? N  Some recent data might be hidden    Patient Care Team: Wendie Agreste, MD as PCP - General (Family Medicine) Lars Masson, MD as Referring Physician (Cardiology) Lavonna Monarch, MD as Consulting Physician (Dermatology)  Indicate any recent Medical Services you may have received from other than [facility] providers in the past year (date may be approximate).     Assessment:   This is a routine wellness examination for Sylvia Bowen.  Hearing/Vision screen Vision Screening - Comments:: Annual eye exams wear glasses   Dietary issues and exercise activities discussed: Current Exercise Habits: Home exercise routine, Type of exercise: walking, Time (Minutes): 30, Frequency (Times/Week): 3, Weekly Exercise (Minutes/Week): 90, Intensity: Mild, Exercise limited by: None identified   Goals Addressed             This Visit's Progress    Patient Stated   On track    Increase activity, drink more water & eat healthier        Depression Screen PHQ 2/9 Scores 11/13/2021 11/13/2021 08/14/2021 10/14/2020 09/03/2020  PHQ - 2 Score 0 0 0 0 0  PHQ- 9 Score - - 1 - 0    Fall Risk Fall Risk  11/13/2021 08/14/2021 10/14/2020 09/03/2020  Falls in the past year? 0 1 1 0  Number falls in past yr: 0 0 0 0  Injury with Fall? 0 1 1 0  Comment - January 2022, riub dislocation - -  Risk for fall due to : No Fall Risks No Fall Risks History of fall(s) -  Follow up Falls evaluation completed Falls evaluation completed Falls prevention discussed Falls evaluation  completed    FALL RISK PREVENTION PERTAINING TO THE HOME:  Any stairs in or around the home? Yes  If so, are there any without handrails? No  Home free of loose throw rugs in walkways, pet beds, electrical cords, etc? Yes  Adequate lighting in your home to reduce risk of falls? Yes   ASSISTIVE DEVICES UTILIZED TO PREVENT FALLS:  Life alert? No  Use of a cane, walker or w/c? No  Grab bars in the bathroom? No  Shower chair or bench in shower? No  Elevated toilet seat or a handicapped toilet? No   TIMED UP AND GO:  Was the test performed? Yes .  Length of time to ambulate 10 feet: 10 sec.   Gait steady and fast without use of assistive device  Cognitive Function:Normal cognitive status assessed by direct observation by this Nurse Health Advisor. No abnormalities found.          Immunizations Immunization History  Administered Date(s) Administered   Fluad Quad(high Dose 65+) 08/14/2021   Influenza, High Dose Seasonal PF 06/17/2017, 07/21/2019   Pneumococcal Conjugate-13 09/24/2016   Pneumococcal Polysaccharide-23 10/21/2017   Tdap 10/19/2013   Zoster Recombinat (Shingrix) 08/11/2019, 10/31/2019    TDAP status: Up to date  Flu Vaccine status: Up to date  Pneumococcal vaccine status: Up to date  Covid-19 vaccine status: Completed vaccines  Qualifies for Shingles Vaccine? Yes   Zostavax completed Yes   Shingrix Completed?: Yes  Screening  Tests Health Maintenance  Topic Date Due   MAMMOGRAM  Never done   COVID-19 Vaccine (2 - Janssen risk series) 02/26/2020   Hepatitis C Screening  08/14/2022 (Originally 03/04/1964)   TETANUS/TDAP  10/20/2023   COLONOSCOPY (Pts 45-68yrs Insurance coverage will need to be confirmed)  10/19/2026   Pneumonia Vaccine 61+ Years old  Completed   INFLUENZA VACCINE  Completed   DEXA SCAN  Completed   Zoster Vaccines- Shingrix  Completed   HPV VACCINES  Aged Out    Health Maintenance  Health Maintenance Due  Topic Date Due   MAMMOGRAM  Never done   COVID-19 Vaccine (2 - Janssen risk series) 02/26/2020    Colorectal cancer screening: Type of screening: Colonoscopy. Completed 10/19/2016. Repeat every 10 years  Mammogram status: Ordered scheduleed 04/047/2023. Pt provided with contact info and advised to call to schedule appt.   Bone Density status: Ordered 01/23/2022. Pt provided with contact info and advised to call to schedule appt.  Lung Cancer Screening: (Low Dose CT Chest recommended if Age 43-80 years, 30 pack-year currently smoking OR have quit w/in 15years.) does not qualify.   Lung Cancer Screening Referral: n/a  Additional Screening:  Hepatitis C Screening: does qualify;   Vision Screening: Recommended annual ophthalmology exams for early detection of glaucoma and other disorders of the eye. Is the patient up to date with their annual eye exam?  Yes  Who is the provider or what is the name of the office in which the patient attends annual eye exams? [facility]  If pt is not established with a provider, would they like to be referred to a provider to establish care? No .   Dental Screening: Recommended annual dental exams for proper oral hygiene  Community Resource Referral / Chronic Care Management: CRR required this visit?  No   CCM required this visit?  No      Plan:     I have personally reviewed and noted the following in the patients chart:    Medical  and social history Use of alcohol, tobacco or illicit drugs  Current medications and supplements including opioid prescriptions.  Functional ability and status Nutritional status Physical activity Advanced directives List of other physicians Hospitalizations, surgeries, and ER visits in previous 12 months Vitals Screenings to include cognitive, depression, and falls Referrals and appointments  In addition, I have reviewed and discussed with patient certain preventive protocols, quality metrics, and best practice recommendations. A written personalized care plan for preventive services as well as general preventive health recommendations were provided to patient.     Randel Pigg, LPN   2/75/1700   Nurse Notes: none

## 2021-11-13 NOTE — Patient Instructions (Addendum)
Sylvia Bowen , Thank you for taking time to come for your Medicare Wellness Visit. I appreciate your ongoing commitment to your health goals. Please review the following plan we discussed and let me know if I can assist you in the future.   Screening recommendations/referrals: Colonoscopy: 10/19/2013 Mammogram: schedule 02/02/2022 Bone Density: schedule 02/02/2022 Recommended yearly ophthalmology/optometry visit for glaucoma screening and checkup Recommended yearly dental visit for hygiene and checkup  Vaccinations: Influenza vaccine: completed  Pneumococcal vaccine: completed  Tdap vaccine: 10/19/2013 Shingles vaccine: completed     Advanced directives: none   Conditions/risks identified: none   Next appointment: CPE 12/12/2021 0800  Dr.Greene    Preventive Care 74 Years and Older, Female Preventive care refers to lifestyle choices and visits with your health care provider that can promote health and wellness. What does preventive care include? A yearly physical exam. This is also called an annual well check. Dental exams once or twice a year. Routine eye exams. Ask your health care provider how often you should have your eyes checked. Personal lifestyle choices, including: Daily care of your teeth and gums. Regular physical activity. Eating a healthy diet. Avoiding tobacco and drug use. Limiting alcohol use. Practicing safe sex. Taking low-dose aspirin every day. Taking vitamin and mineral supplements as recommended by your health care provider. What happens during an annual well check? The services and screenings done by your health care provider during your annual well check will depend on your age, overall health, lifestyle risk factors, and family history of disease. Counseling  Your health care provider may ask you questions about your: Alcohol use. Tobacco use. Drug use. Emotional well-being. Home and relationship well-being. Sexual activity. Eating  habits. History of falls. Memory and ability to understand (cognition). Work and work Statistician. Reproductive health. Screening  You may have the following tests or measurements: Height, weight, and BMI. Blood pressure. Lipid and cholesterol levels. These may be checked every 5 years, or more frequently if you are over 85 years old. Skin check. Lung cancer screening. You may have this screening every year starting at age 76 if you have a 30-pack-year history of smoking and currently smoke or have quit within the past 15 years. Fecal occult blood test (FOBT) of the stool. You may have this test every year starting at age 17. Flexible sigmoidoscopy or colonoscopy. You may have a sigmoidoscopy every 5 years or a colonoscopy every 10 years starting at age 25. Hepatitis C blood test. Hepatitis B blood test. Sexually transmitted disease (STD) testing. Diabetes screening. This is done by checking your blood sugar (glucose) after you have not eaten for a while (fasting). You may have this done every 1-3 years. Bone density scan. This is done to screen for osteoporosis. You may have this done starting at age 58. Mammogram. This may be done every 1-2 years. Talk to your health care provider about how often you should have regular mammograms. Talk with your health care provider about your test results, treatment options, and if necessary, the need for more tests. Vaccines  Your health care provider may recommend certain vaccines, such as: Influenza vaccine. This is recommended every year. Tetanus, diphtheria, and acellular pertussis (Tdap, Td) vaccine. You may need a Td booster every 10 years. Zoster vaccine. You may need this after age 44. Pneumococcal 13-valent conjugate (PCV13) vaccine. One dose is recommended after age 54. Pneumococcal polysaccharide (PPSV23) vaccine. One dose is recommended after age 57. Talk to your health care provider about which screenings and vaccines  you need and how  often you need them. This information is not intended to replace advice given to you by your health care provider. Make sure you discuss any questions you have with your health care provider. Document Released: 11/01/2015 Document Revised: 06/24/2016 Document Reviewed: 08/06/2015 Elsevier Interactive Patient Education  2017 [place] Prevention in the Home Falls can cause injuries. They can happen to people of all ages. There are many things you can do to make your home safe and to help prevent falls. What can I do on the outside of my home? Regularly fix the edges of walkways and driveways and fix any cracks. Remove anything that might make you trip as you walk through a door, such as a raised step or threshold. Trim any bushes or trees on the path to your home. Use bright outdoor lighting. Clear any walking paths of anything that might make someone trip, such as rocks or tools. Regularly check to see if handrails are loose or broken. Make sure that both sides of any steps have handrails. Any raised decks and porches should have guardrails on the edges. Have any leaves, snow, or ice cleared regularly. Use sand or salt on walking paths during winter. Clean up any spills in your garage right away. This includes oil or grease spills. What can I do in the bathroom? Use night lights. Install grab bars by the toilet and in the tub and shower. Do not use towel bars as grab bars. Use non-skid mats or decals in the tub or shower. If you need to sit down in the shower, use a plastic, non-slip stool. Keep the floor dry. Clean up any water that spills on the floor as soon as it happens. Remove soap buildup in the tub or shower regularly. Attach bath mats securely with double-sided non-slip rug tape. Do not have throw rugs and other things on the floor that can make you trip. What can I do in the bedroom? Use night lights. Make sure that you have a light by your bed that is easy to  reach. Do not use any sheets or blankets that are too big for your bed. They should not hang down onto the floor. Have a firm chair that has side arms. You can use this for support while you get dressed. Do not have throw rugs and other things on the floor that can make you trip. What can I do in the kitchen? Clean up any spills right away. Avoid walking on wet floors. Keep items that you use a lot in easy-to-reach places. If you need to reach something above you, use a strong step stool that has a grab bar. Keep electrical cords out of the way. Do not use floor polish or wax that makes floors slippery. If you must use wax, use non-skid floor wax. Do not have throw rugs and other things on the floor that can make you trip. What can I do with my stairs? Do not leave any items on the stairs. Make sure that there are handrails on both sides of the stairs and use them. Fix handrails that are broken or loose. Make sure that handrails are as long as the stairways. Check any carpeting to make sure that it is firmly attached to the stairs. Fix any carpet that is loose or worn. Avoid having throw rugs at the top or bottom of the stairs. If you do have throw rugs, attach them to the floor with carpet tape. Make sure  that you have a light switch at the top of the stairs and the bottom of the stairs. If you do not have them, ask someone to add them for you. What else can I do to help prevent falls? Wear shoes that: Do not have high heels. Have rubber bottoms. Are comfortable and fit you well. Are closed at the toe. Do not wear sandals. If you use a stepladder: Make sure that it is fully opened. Do not climb a closed stepladder. Make sure that both sides of the stepladder are locked into place. Ask someone to hold it for you, if possible. Clearly mark and make sure that you can see: Any grab bars or handrails. First and last steps. Where the edge of each step is. Use tools that help you move  around (mobility aids) if they are needed. These include: Canes. Walkers. Scooters. Crutches. Turn on the lights when you go into a dark area. Replace any light bulbs as soon as they burn out. Set up your furniture so you have a clear path. Avoid moving your furniture around. If any of your floors are uneven, fix them. If there are any pets around you, be aware of where they are. Review your medicines with your doctor. Some medicines can make you feel dizzy. This can increase your chance of falling. Ask your doctor what other things that you can do to help prevent falls. This information is not intended to replace advice given to you by your health care provider. Make sure you discuss any questions you have with your health care provider. Document Released: 08/01/2009 Document Revised: 03/12/2016 Document Reviewed: 11/09/2014 Elsevier Interactive Patient Education  2017 [employer].

## 2021-11-25 DIAGNOSIS — H2513 Age-related nuclear cataract, bilateral: Secondary | ICD-10-CM | POA: Diagnosis not present

## 2021-11-25 LAB — HM DIABETES EYE EXAM

## 2021-11-28 DIAGNOSIS — M9904 Segmental and somatic dysfunction of sacral region: Secondary | ICD-10-CM | POA: Diagnosis not present

## 2021-11-28 DIAGNOSIS — M531 Cervicobrachial syndrome: Secondary | ICD-10-CM | POA: Diagnosis not present

## 2021-11-28 DIAGNOSIS — M9901 Segmental and somatic dysfunction of cervical region: Secondary | ICD-10-CM | POA: Diagnosis not present

## 2021-11-28 DIAGNOSIS — M9905 Segmental and somatic dysfunction of pelvic region: Secondary | ICD-10-CM | POA: Diagnosis not present

## 2021-12-12 ENCOUNTER — Encounter: Payer: Self-pay | Admitting: Family Medicine

## 2021-12-12 ENCOUNTER — Ambulatory Visit (INDEPENDENT_AMBULATORY_CARE_PROVIDER_SITE_OTHER): Payer: Medicare PPO | Admitting: Family Medicine

## 2021-12-12 VITALS — BP 124/68 | HR 67 | Temp 97.7°F | Resp 16 | Ht 66.0 in | Wt 141.8 lb

## 2021-12-12 DIAGNOSIS — J302 Other seasonal allergic rhinitis: Secondary | ICD-10-CM | POA: Diagnosis not present

## 2021-12-12 DIAGNOSIS — Z0000 Encounter for general adult medical examination without abnormal findings: Secondary | ICD-10-CM

## 2021-12-12 DIAGNOSIS — Z131 Encounter for screening for diabetes mellitus: Secondary | ICD-10-CM

## 2021-12-12 DIAGNOSIS — Z130 Encounter for screening for diseases of the blood and blood-forming organs and certain disorders involving the immune mechanism: Secondary | ICD-10-CM | POA: Diagnosis not present

## 2021-12-12 DIAGNOSIS — M9901 Segmental and somatic dysfunction of cervical region: Secondary | ICD-10-CM | POA: Diagnosis not present

## 2021-12-12 DIAGNOSIS — M9904 Segmental and somatic dysfunction of sacral region: Secondary | ICD-10-CM | POA: Diagnosis not present

## 2021-12-12 DIAGNOSIS — Z0001 Encounter for general adult medical examination with abnormal findings: Secondary | ICD-10-CM

## 2021-12-12 DIAGNOSIS — M531 Cervicobrachial syndrome: Secondary | ICD-10-CM | POA: Diagnosis not present

## 2021-12-12 DIAGNOSIS — M9905 Segmental and somatic dysfunction of pelvic region: Secondary | ICD-10-CM | POA: Diagnosis not present

## 2021-12-12 DIAGNOSIS — R42 Dizziness and giddiness: Secondary | ICD-10-CM | POA: Diagnosis not present

## 2021-12-12 DIAGNOSIS — R140 Abdominal distension (gaseous): Secondary | ICD-10-CM | POA: Diagnosis not present

## 2021-12-12 DIAGNOSIS — Z13220 Encounter for screening for lipoid disorders: Secondary | ICD-10-CM

## 2021-12-12 MED ORDER — IPRATROPIUM BROMIDE 0.06 % NA SOLN: 1.0000 | Freq: Four times a day (QID) | NASAL | 5 refills | Status: DC

## 2021-12-12 MED ORDER — FLUTICASONE PROPIONATE 50 MCG/ACT NA SUSP: 1.0000 | Freq: Every day | NASAL | 6 refills | Status: DC

## 2021-12-12 NOTE — Progress Notes (Signed)
Subjective:  Patient ID: Sylvia Bowen, female    DOB: 18-Apr-1946  Age: 76 y.o. MRN: [id number]  CC:  Chief Complaint  Patient presents with   Medicare Wellness    Pt reports she has had some dizziness on and off for about a month notes random times no associated actions     HPI [name] presents for   Presents for annual exam and concern above dizziness.  Had [facility] wellness exam on 11/13/21.   Dizziness: Off and on for a few years, more episodic for the past month.   History of seasonal allergies, GERD, hemorrhoids on chart review. Notices more with sinus congestion. Some flairs of allergies - just restarted flonase few days ago.  No chest pain, palpitations, dark stools, no near syncope.  Lightheaded,  no change with position or room spinning.  Usually notices with walking around house or working, worse in am Drinking more fluids. 2 meals per day, some fruit in between.  Slight lightheaded now.  Transfusion years ago for anemia with menstrual bleeding.   Lab Results  Component Value Date   WBC 4.2 09/03/2020   HGB 13.9 09/03/2020   HCT 41.2 09/03/2020   MCV 92.1 09/03/2020   PLT 235.0 09/03/2020   abdominal cramping Episodic in past, more often in past few months. No fever.  No blood in stool. No vomiting. Has not discussed with GI.  Some diarrhea at times, notes with using too much fiber.   Care team: PCP: me Dermatology: Dr. Denna Haggard Chiropractor: Dr. Karlene Einstein Gastroenterology: Dr. Samule Ohm, hemorrhoids, no acute issues. Managed with fiber intake.   Fall screening Fall Risk  12/12/2021 11/13/2021 08/14/2021 10/14/2020 09/03/2020  Falls in the past year? 1 0 1 1 0  Number falls in past yr: 0 0 0 0 0  Injury with Fall? 1 0 1 1 0  Comment - - January 2022, riub dislocation - -  Risk for fall due to : No Fall Risks No Fall Risks No Fall Risks History of fall(s) -  Follow up Falls evaluation completed Falls evaluation completed Falls evaluation completed Falls  prevention discussed Falls evaluation completed   Lighting in home: adequate, working on cataracts with [facility]. Planned optho eval in April.  Loose rugs/carpets/pets: few loose rugs. Discussed fall risk Stairs: single level, handrail for back steps.  Grab bars in bathroom: none.  Timed up and go:8 seconds, normal gait, no instability.   Depression Screening: Depression screen [facility] 2/9 12/12/2021 11/13/2021 11/13/2021 08/14/2021 10/14/2020  Decreased Interest 0 0 0 0 0  Down, Depressed, Hopeless 0 0 0 0 0  PHQ - 2 Score 0 0 0 0 0  Altered sleeping 1 - - 1 -  Tired, decreased energy 1 - - 0 -  Change in appetite 0 - - 0 -  Feeling bad or failure about yourself  0 - - 0 -  Trouble concentrating 0 - - 0 -  Moving slowly or fidgety/restless 0 - - 0 -  Suicidal thoughts 0 - - 0 -  PHQ-9 Score 2 - - 1 -  Difficult doing work/chores - - - - -    Cancer Screening: Colonoscopy 10/19/2016, patient reported. No follow up needed? Discussed with GI.  Mammogram scheduled April 7, along with bone density. S/p hysterectomy for cystocoele, benign.  Followed by dermatology.   Immunization History  Administered Date(s) Administered   Fluad Quad(high Dose 65+) 08/14/2021   Influenza, High Dose Seasonal PF 06/17/2017, 07/21/2019  Pneumococcal Conjugate-13 09/24/2016   Pneumococcal Polysaccharide-23 10/21/2017   Tdap 10/19/2013   Zoster Recombinat (Shingrix) 08/11/2019, 10/31/2019  COVID-19 vaccine: Delsa Sale vaccine, declines boosters.   Functional Status Survey: Is the patient deaf or have difficulty hearing?: No Does the patient have difficulty seeing, even when wearing glasses/contacts?: No (recent Dx cataracts both eyes) Does the patient have difficulty concentrating, remembering, or making decisions?: No Does the patient have difficulty walking or climbing stairs?: No Does the patient have difficulty dressing or bathing?: No Does the patient have difficulty doing errands alone  such as visiting a doctor's office or shopping?: No  Memory Screen: 6CIT Screen 12/12/2021  What Year? 0 points  What month? 0 points  What time? 0 points  Count back from 20 0 points  Months in reverse 0 points  Repeat phrase 0 points  Total Score 0    Alcohol Screening: [place] Visit from 12/12/2021 in [facility]  AUDIT-C Score 0     No alcohol.   Tobacco: none  No results found. Optho/optometry:as above - wears glasses and cataracts managed , plan to see soon for cataracts.   Dental: Dr. Delilah Shan at [facility], every 8 months.   Exercise: Walking every morning, 15-20 minutes. Vigorous walk.   Advanced Directives:  Does not have advanced directives, paperwork given.   History Patient Active Problem List   Diagnosis Date Noted   Seasonal allergies 09/03/2020   Fever blister    History of colon polyps    Heart murmur    Leukopenia 08/14/2019   Recurrent herpes labialis 10/21/2017   Osteoporosis 11/09/2016   Cortical age-related cataract of both eyes 10/29/2016   Past Medical History:  Diagnosis Date   Allergy    Anemia    Arthritis    Asthma    Blood transfusion without reported diagnosis 2020   Fever blister    Heart murmur    History of colon polyps    Osteoporosis    Past Surgical History:  Procedure Laterality Date   ABDOMINAL HYSTERECTOMY  2012   DILATION AND CURETTAGE OF UTERUS  1983   KNEE ARTHROSCOPY  2015   Allergies  Allergen Reactions   Clindamycin Rash   Erythromycin Base Rash   Levofloxacin Rash   Prior to Admission medications   Medication Sig Start Date End Date Taking? Authorizing Provider  CALCIUM PO Take by mouth.   Yes [provider]  Cholecalciferol 25 MCG (1000 UT) tablet Take by mouth.   Yes [provider]  fluticasone (FLONASE) 50 MCG/ACT nasal spray Place 1-2 sprays into both nostrils daily. 08/14/21  Yes Wendie Agreste, MD  Multiple  Vitamins-Minerals (MULTIVITAMIN ADULTS PO) Take by mouth.   Yes [provider]  TURMERIC PO Take by mouth.   Yes [provider]  valACYclovir (VALTREX) 1000 MG tablet Take 1 tablet (1,000 mg total) by mouth 2 (two) times daily. 09/03/20  Yes Brunetta Jeans, PA-C  VITAMIN D PO Take by mouth.   Yes [provider]   Social History   Socioeconomic History   Marital status: Married    Spouse name: Not on file   Number of children: Not on file   Years of education: Not on file   Highest education level: Not on file  Occupational History   Not on file  Tobacco Use   Smoking status: Former    Types: Cigarettes   Smokeless tobacco: Never  Vaping Use   Vaping Use:  Never used  Substance and Sexual Activity   Alcohol use: Not Currently   Drug use: Never   Sexual activity: Not Currently    Birth control/protection: Surgical  Other Topics Concern   Not on file  Social History Narrative   Not on file   Social Determinants of Health   Financial Resource Strain: Low Risk    Difficulty of Paying Living Expenses: Not hard at all  Food Insecurity: No Food Insecurity   Worried About Charity fundraiser in the Last Year: Never true   [place] in the Last Year: Never true  Transportation Needs: No Transportation Needs   Lack of Transportation (Medical): No   Lack of Transportation (Non-Medical): No  Physical Activity: Insufficiently Active   Days of Exercise per Week: 5 days   Minutes of Exercise per Session: 20 min  Stress: No Stress Concern Present   Feeling of Stress : Not at all  Social Connections: Socially Integrated   Frequency of Communication with Friends and Family: Twice a week   Frequency of Social Gatherings with Friends and Family: Twice a week   Attends Religious Services: More than 4 times per year   Active Member of [employer] or Organizations: Yes   Attends Music therapist: More than 4 times per year   Marital Status:  Married  Human resources officer Violence: Not At Risk   Fear of Current or Ex-Partner: No   Emotionally Abused: No   Physically Abused: No   Sexually Abused: No    Review of Systems   Objective:   Vitals:   12/12/21 0803  BP: 124/68  Pulse: 67  Resp: 16  Temp: 97.7 F (36.5 C)  TempSrc: Temporal  SpO2: 98%  Weight: 141 lb 12.8 oz (64.3 kg)  Height: 5\' 6"  (1.676 m)     Physical Exam Vitals reviewed.  Constitutional:      Appearance: She is well-developed.  HENT:     Head: Normocephalic and atraumatic.     Right Ear: External ear normal.     Left Ear: External ear normal.  Eyes:     Conjunctiva/sclera: Conjunctivae normal.     Pupils: Pupils are equal, round, and reactive to light.     Comments: Few beats of horizontal nystagmus with minimal dizziness.  Neck:     Thyroid: No thyromegaly.  Cardiovascular:     Rate and Rhythm: Normal rate and regular rhythm.     Heart sounds: Normal heart sounds. No murmur heard. Pulmonary:     Effort: Pulmonary effort is normal. No respiratory distress.     Breath sounds: Normal breath sounds. No wheezing.  Abdominal:     General: Bowel sounds are normal. There is no distension.     Palpations: Abdomen is soft.     Tenderness: There is abdominal tenderness (Minimal discomfort lower abdomen bilaterally without rebound or guarding.). There is no guarding or rebound.  Musculoskeletal:        General: No tenderness. Normal range of motion.     Cervical back: Normal range of motion and neck supple.  Lymphadenopathy:     Cervical: No cervical adenopathy.  Skin:    General: Skin is warm and dry.     Findings: No rash.  Neurological:     Mental Status: She is alert and oriented to person, place, and time.  Psychiatric:        Behavior: Behavior normal.        Thought Content: Thought content normal.  EKG, sinus rhythm.  No acute ST/T wave changes.  No prior EKG available for review.   Assessment & Plan:  NASHIA REMUS is a 76  y.o. female . Annual physical exam - Plan: fluticasone (FLONASE) 50 MCG/ACT nasal spray, CBC with Differential/Platelet, Lipid panel, Comprehensive metabolic panel, Hemoglobin A1c  - - anticipatory guidance as below in AVS, screening labs if needed. Health maintenance items as above in HPI discussed/recommended as applicable.  - no concerning responses on depression, fall, or functional status screening. Any positive responses noted as above. Advanced directives discussed as in [facility].   Seasonal allergies - Plan: fluticasone (FLONASE) 50 MCG/ACT nasal spray, ipratropium (ATROVENT) 0.06 % nasal spray  -Recent restart of Flonase, may notice some improvement soon.  Atrovent provided additionally if needed.  Screening for deficiency anemia - Plan: CBC with Differential/Platelet  Screening for hyperlipidemia - Plan: Lipid panel, Comprehensive metabolic panel  Screening for diabetes mellitus - Plan: Comprehensive metabolic panel, Hemoglobin A1c  Dizziness - Plan: CBC with Differential/Platelet, Comprehensive metabolic panel, EKG 09-TOIZ  -No acute findings on EKG, check CBC.  Possible component with allergies and eustachian tube dysfunction, possible component of middle ear/vertigo with few beats of nystagmus but symptoms not typical vertiginous type of dizziness.  Check electrolytes as above.  Handout given, fluids, rest, ER precautions given.  RTC precautions given.  Abdominal bloating  -Overall reassuring exam.  Could be related to overuse of fiber.  Check labs.  Follow-up with gastroenterology if persistent symptoms with RTC/ER precautions if acute worsening.  Meds ordered this encounter  Medications   fluticasone (FLONASE) 50 MCG/ACT nasal spray    Sig: Place 1-2 sprays into both nostrils daily.    Dispense:  16 g    Refill:  6   ipratropium (ATROVENT) 0.06 % nasal spray    Sig: Place 1-2 sprays into both nostrils 4 (four) times daily. As needed for nasal congestion    Dispense:  15 mL     Refill:  5   Patient Instructions  I will check some labs for dizziness. Allergies and sinus congestion may be contributing.  Continue flonase, option of atrovent nasal spray temporarily. Recheck in next 2 weeks.  Return to the clinic or go to the nearest emergency room if any of your symptoms worsen or new symptoms occur.    Dizziness Dizziness is a common problem. It is a feeling of unsteadiness or light-headedness. You may feel like you are about to faint. Dizziness can lead to injury if you stumble or fall. Anyone can become dizzy, but dizziness is more common in older adults. This condition can be caused by a number of things, including medicines, dehydration, or illness. Follow these instructions at home: Eating and drinking  Drink enough fluid to keep your urine pale yellow. This helps to keep you from becoming dehydrated. Try to drink more clear fluids, such as water. Do not drink alcohol. Limit your caffeine intake if told to do so by your health care provider. Check ingredients and nutrition facts to see if a food or beverage contains caffeine. Limit your salt (sodium) intake if told to do so by your health care provider. Check ingredients and nutrition facts to see if a food or beverage contains sodium. Activity  Avoid making quick movements. Rise slowly from chairs and steady yourself until you feel okay. In the morning, first sit up on the side of the bed. When you feel okay, stand slowly while you hold onto something until you know that  your balance is good. If you need to stand in one place for a long time, move your legs often. Tighten and relax the muscles in your legs while you are standing. Do not drive or use machinery if you feel dizzy. Avoid bending down if you feel dizzy. Place items in your home so that they are easy for you to reach without leaning over. Lifestyle Do not use any products that contain nicotine or tobacco. These products include cigarettes, chewing  tobacco, and vaping devices, such as e-cigarettes. If you need help quitting, ask your health care provider. Try to reduce your stress level by using methods such as yoga or meditation. Talk with your health care provider if you need help to manage your stress. General instructions Watch your dizziness for any changes. Take over-the-counter and prescription medicines only as told by your health care provider. Talk with your health care provider if you think that your dizziness is caused by a medicine that you are taking. Tell a friend or a family member that you are feeling dizzy. If he or she notices any changes in your behavior, have this person call your health care provider. Keep all follow-up visits. This is important. Contact a health care provider if: Your dizziness does not go away or you have new symptoms. Your dizziness or light-headedness gets worse. You feel nauseous. You have reduced hearing. You have a fever. You have neck pain or a stiff neck. Your dizziness leads to an injury or a fall. Get help right away if: You vomit or have diarrhea and are unable to eat or drink anything. You have problems talking, walking, swallowing, or using your arms, hands, or legs. You feel generally weak. You have any bleeding. You are not thinking clearly or you have trouble forming sentences. It may take a friend or family member to notice this. You have chest pain, abdominal pain, shortness of breath, or sweating. Your vision changes or you develop a severe headache. These symptoms may represent a serious problem that is an emergency. Do not wait to see if the symptoms will go away. Get medical help right away. Call your local emergency services (911 in the [place] Do not drive yourself to the hospital. Summary Dizziness is a feeling of unsteadiness or light-headedness. This condition can be caused by a number of things, including medicines, dehydration, or illness. Anyone can become dizzy, but  dizziness is more common in older adults. Drink enough fluid to keep your urine pale yellow. Do not drink alcohol. Avoid making quick movements if you feel dizzy. Monitor your dizziness for any changes. This information is not intended to replace advice given to you by your health care provider. Make sure you discuss any questions you have with your health care provider. Document Revised: 09/09/2020 Document Reviewed: 09/09/2020 Elsevier Patient Education  2022 [place],   Merri Ray, MD [facility] 12/12/21 9:04 AM

## 2021-12-12 NOTE — Patient Instructions (Addendum)
I will check some labs for dizziness. Allergies and sinus congestion may be contributing.  Continue flonase, option of atrovent nasal spray temporarily. Recheck in next 2 weeks.  Return to the clinic or go to the nearest emergency room if any of your symptoms worsen or new symptoms occur.    Dizziness Dizziness is a common problem. It is a feeling of unsteadiness or light-headedness. You may feel like you are about to faint. Dizziness can lead to injury if you stumble or fall. Anyone can become dizzy, but dizziness is more common in older adults. This condition can be caused by a number of things, including medicines, dehydration, or illness. Follow these instructions at home: Eating and drinking  Drink enough fluid to keep your urine pale yellow. This helps to keep you from becoming dehydrated. Try to drink more clear fluids, such as water. Do not drink alcohol. Limit your caffeine intake if told to do so by your health care provider. Check ingredients and nutrition facts to see if a food or beverage contains caffeine. Limit your salt (sodium) intake if told to do so by your health care provider. Check ingredients and nutrition facts to see if a food or beverage contains sodium. Activity  Avoid making quick movements. Rise slowly from chairs and steady yourself until you feel okay. In the morning, first sit up on the side of the bed. When you feel okay, stand slowly while you hold onto something until you know that your balance is good. If you need to stand in one place for a long time, move your legs often. Tighten and relax the muscles in your legs while you are standing. Do not drive or use machinery if you feel dizzy. Avoid bending down if you feel dizzy. Place items in your home so that they are easy for you to reach without leaning over. Lifestyle Do not use any products that contain nicotine or tobacco. These products include cigarettes, chewing tobacco, and vaping devices, such as  e-cigarettes. If you need help quitting, ask your health care provider. Try to reduce your stress level by using methods such as yoga or meditation. Talk with your health care provider if you need help to manage your stress. General instructions Watch your dizziness for any changes. Take over-the-counter and prescription medicines only as told by your health care provider. Talk with your health care provider if you think that your dizziness is caused by a medicine that you are taking. Tell a friend or a family member that you are feeling dizzy. If he or she notices any changes in your behavior, have this person call your health care provider. Keep all follow-up visits. This is important. Contact a health care provider if: Your dizziness does not go away or you have new symptoms. Your dizziness or light-headedness gets worse. You feel nauseous. You have reduced hearing. You have a fever. You have neck pain or a stiff neck. Your dizziness leads to an injury or a fall. Get help right away if: You vomit or have diarrhea and are unable to eat or drink anything. You have problems talking, walking, swallowing, or using your arms, hands, or legs. You feel generally weak. You have any bleeding. You are not thinking clearly or you have trouble forming sentences. It may take a friend or family member to notice this. You have chest pain, abdominal pain, shortness of breath, or sweating. Your vision changes or you develop a severe headache. These symptoms may represent a serious problem that  is an emergency. Do not wait to see if the symptoms will go away. Get medical help right away. Call your local emergency services (911 in the [place] Do not drive yourself to the hospital. Summary Dizziness is a feeling of unsteadiness or light-headedness. This condition can be caused by a number of things, including medicines, dehydration, or illness. Anyone can become dizzy, but dizziness is more common in older  adults. Drink enough fluid to keep your urine pale yellow. Do not drink alcohol. Avoid making quick movements if you feel dizzy. Monitor your dizziness for any changes. This information is not intended to replace advice given to you by your health care provider. Make sure you discuss any questions you have with your health care provider. Document Revised: 09/09/2020 Document Reviewed: 09/09/2020 Elsevier Patient Education  2022 [employer].

## 2021-12-15 ENCOUNTER — Other Ambulatory Visit (INDEPENDENT_AMBULATORY_CARE_PROVIDER_SITE_OTHER): Payer: Medicare PPO

## 2021-12-15 DIAGNOSIS — Z0000 Encounter for general adult medical examination without abnormal findings: Secondary | ICD-10-CM

## 2021-12-15 DIAGNOSIS — Z130 Encounter for screening for diseases of the blood and blood-forming organs and certain disorders involving the immune mechanism: Secondary | ICD-10-CM

## 2021-12-15 DIAGNOSIS — Z131 Encounter for screening for diabetes mellitus: Secondary | ICD-10-CM

## 2021-12-15 DIAGNOSIS — Z13220 Encounter for screening for lipoid disorders: Secondary | ICD-10-CM | POA: Diagnosis not present

## 2021-12-15 DIAGNOSIS — R42 Dizziness and giddiness: Secondary | ICD-10-CM | POA: Diagnosis not present

## 2021-12-15 LAB — LIPID PANEL
Cholesterol: 287 mg/dL — ABNORMAL HIGH (ref 0–200)
HDL: 70.1 mg/dL (ref >39.00)
LDL Cholesterol: 193 mg/dL — ABNORMAL HIGH (ref 0–99)
NonHDL: 217.11
Total CHOL/HDL Ratio: 4
Triglycerides: 120 mg/dL (ref 0.0–149.0)
VLDL: 24 mg/dL (ref 0.0–40.0)

## 2021-12-15 LAB — CBC WITH DIFFERENTIAL/PLATELET
Basophils Absolute: 0 10*3/uL (ref 0.0–0.1)
Basophils Relative: 1.1 % (ref 0.0–3.0)
Eosinophils Absolute: 0.2 10*3/uL (ref 0.0–0.7)
Eosinophils Relative: 6 % — ABNORMAL HIGH (ref 0.0–5.0)
HCT: 43.8 % (ref 36.0–46.0)
Hemoglobin: 14.6 g/dL (ref 12.0–15.0)
Lymphocytes Relative: 30.4 % (ref 12.0–46.0)
Lymphs Abs: 1 10*3/uL (ref 0.7–4.0)
MCHC: 33.2 g/dL (ref 30.0–36.0)
MCV: 92.2 fl (ref 78.0–100.0)
Monocytes Absolute: 0.3 10*3/uL (ref 0.1–1.0)
Monocytes Relative: 8.1 % (ref 3.0–12.0)
Neutro Abs: 1.8 10*3/uL (ref 1.4–7.7)
Neutrophils Relative %: 54.4 % (ref 43.0–77.0)
Platelets: 232 10*3/uL (ref 150.0–400.0)
RBC: 4.75 Mil/uL (ref 3.87–5.11)
RDW: 13.2 % (ref 11.5–15.5)
WBC: 3.4 10*3/uL — ABNORMAL LOW (ref 4.0–10.5)

## 2021-12-15 LAB — COMPREHENSIVE METABOLIC PANEL
ALT: 12 U/L (ref 0–35)
AST: 15 U/L (ref 0–37)
Albumin: 4.5 g/dL (ref 3.5–5.2)
Alkaline Phosphatase: 63 U/L (ref 39–117)
BUN: 12 mg/dL (ref 6–23)
CO2: 27 mEq/L (ref 19–32)
Calcium: 9.5 mg/dL (ref 8.4–10.5)
Chloride: 104 mEq/L (ref 96–112)
Creatinine, Ser: 0.76 mg/dL (ref 0.40–1.20)
GFR: 77 mL/min (ref >60.00)
Glucose, Bld: 90 mg/dL (ref 70–99)
Potassium: 4.3 mEq/L (ref 3.5–5.1)
Sodium: 141 mEq/L (ref 135–145)
Total Bilirubin: 0.6 mg/dL (ref 0.2–1.2)
Total Protein: 7.3 g/dL (ref 6.0–8.3)

## 2021-12-15 LAB — HEMOGLOBIN A1C: Hgb A1c MFr Bld: 5.5 % (ref 4.6–6.5)

## 2021-12-25 ENCOUNTER — Encounter: Payer: Self-pay | Admitting: Family Medicine

## 2021-12-25 ENCOUNTER — Ambulatory Visit: Payer: Medicare PPO | Admitting: Family Medicine

## 2021-12-25 VITALS — BP 116/70 | HR 65 | Temp 97.9°F | Resp 15 | Ht 66.0 in | Wt 142.2 lb

## 2021-12-25 DIAGNOSIS — R42 Dizziness and giddiness: Secondary | ICD-10-CM

## 2021-12-25 DIAGNOSIS — E785 Hyperlipidemia, unspecified: Secondary | ICD-10-CM | POA: Diagnosis not present

## 2021-12-25 DIAGNOSIS — B001 Herpesviral vesicular dermatitis: Secondary | ICD-10-CM | POA: Diagnosis not present

## 2021-12-25 MED ORDER — ROSUVASTATIN CALCIUM 5 MG PO TABS: 5.0000 mg | ORAL_TABLET | Freq: Every day | ORAL | 1 refills | Status: DC

## 2021-12-25 MED ORDER — VALACYCLOVIR HCL 500 MG PO TABS: 500.0000 mg | ORAL_TABLET | Freq: Two times a day (BID) | ORAL | 1 refills | Status: DC

## 2021-12-25 NOTE — Progress Notes (Signed)
Subjective:  Patient ID: Sylvia Bowen, female    DOB: [birth year]  Age: 76 y.o. MRN: [id number]  CC:  Chief Complaint  Patient presents with   Dizziness    Pt reports some improvement over the last 2 weeks, notes no changes otherwise    Results    Pt would like to discuss lab results while she is in the office as well     HPI Sylvia Bowen presents for  Dizziness Discussed at February 24 visit.  No acute findings on EKG at that time.  Possible component of eustachian tube dysfunction, middle ear symptoms with a few beats of nystagmus on exam but not typical vertigo symptoms.  Fluids, rest, CBC ordered.continued flonase for allergies, option of atrovent.   Improved since last visit. More rest, less stress.  Flonase has been working well. Returning to normal, prior cough has improved as well.  No syncope/near-syncope. No chest pain.  Lab Results  Component Value Date   WBC 3.4 (L) 12/15/2021   HGB 14.6 12/15/2021   HCT 43.8 12/15/2021   MCV 92.2 12/15/2021   PLT 232.0 12/15/2021   Hyperlipidemia: No current statin.  Muscle aches with prior statin - not sure which one. Unknown name.  No known FH of familial HLD syndrome.  Father with pacemaker, mild MI in 30's.  Lab Results  Component Value Date   CHOL 287 (H) 12/15/2021   HDL 70.10 12/15/2021   LDLCALC 193 (H) 12/15/2021   TRIG 120.0 12/15/2021   CHOLHDL 4 12/15/2021   Lab Results  Component Value Date   ALT 12 12/15/2021   AST 15 12/15/2021   ALKPHOS 63 12/15/2021   BILITOT 0.6 12/15/2021   Fever blisters: 3 over past month.  Fatigue, dizzy, shaky with '1000mg'$  valtrex dose. Takes BID for few days for flare - works, but side effects limiting.     History Patient Active Problem List   Diagnosis Date Noted   Seasonal allergies 09/03/2020   Fever blister    History of colon polyps    Heart murmur    Leukopenia 08/14/2019   Recurrent herpes labialis 10/21/2017   Osteoporosis 11/09/2016   Cortical  age-related cataract of both eyes 10/29/2016   Past Medical History:  Diagnosis Date   Allergy    Anemia    Arthritis    Asthma    Blood transfusion without reported diagnosis 2020   Fever blister    Heart murmur    History of colon polyps    Osteoporosis    Past Surgical History:  Procedure Laterality Date   ABDOMINAL HYSTERECTOMY  2012   DILATION AND CURETTAGE OF UTERUS  1983   KNEE ARTHROSCOPY  2015   Allergies  Allergen Reactions   Clindamycin Rash   Erythromycin Base Rash   Levofloxacin Rash   Prior to Admission medications   Medication Sig Start Date End Date Taking? Authorizing Provider  CALCIUM PO Take by mouth.    [provider]  Cholecalciferol 25 MCG (1000 UT) tablet Take by mouth.    [provider]  fluticasone (FLONASE) 50 MCG/ACT nasal spray Place 1-2 sprays into both nostrils daily. 12/12/21   Wendie Agreste, MD  ipratropium (ATROVENT) 0.06 % nasal spray Place 1-2 sprays into both nostrils 4 (four) times daily. As needed for nasal congestion 12/12/21   Wendie Agreste, MD  Multiple Vitamins-Minerals (MULTIVITAMIN ADULTS PO) Take by mouth.    [provider]  TURMERIC PO Take by mouth.  [provider]  valACYclovir (VALTREX) 1000 MG tablet Take 1 tablet (1,000 mg total) by mouth 2 (two) times daily. 09/03/20   Brunetta Jeans, PA-C  VITAMIN D PO Take by mouth.    [provider]   Social History   Socioeconomic History   Marital status: Married    Spouse name: Not on file   Number of children: Not on file   Years of education: Not on file   Highest education level: Not on file  Occupational History   Not on file  Tobacco Use   Smoking status: Former    Types: Cigarettes   Smokeless tobacco: Never  Vaping Use   Vaping Use: Never used  Substance and Sexual Activity   Alcohol use: Not Currently   Drug use: Never   Sexual activity: Not Currently    Birth control/protection: Surgical  Other  Topics Concern   Not on file  Social History Narrative   Not on file   Social Determinants of Health   Financial Resource Strain: Low Risk    Difficulty of Paying Living Expenses: Not hard at all  Food Insecurity: No Food Insecurity   Worried About Charity fundraiser in the Last Year: Never true   Smock in the Last Year: Never true  Transportation Needs: No Transportation Needs   Lack of Transportation (Medical): No   Lack of Transportation (Non-Medical): No  Physical Activity: Insufficiently Active   Days of Exercise per Week: 5 days   Minutes of Exercise per Session: 20 min  Stress: No Stress Concern Present   Feeling of Stress : Not at all  Social Connections: Socially Integrated   Frequency of Communication with Friends and Family: Twice a week   Frequency of Social Gatherings with Friends and Family: Twice a week   Attends Religious Services: More than 4 times per year   Active Member of [employer] or Organizations: Yes   Attends Music therapist: More than 4 times per year   Marital Status: Married  Human resources officer Violence: Not At Risk   Fear of Current or Ex-Partner: No   Emotionally Abused: No   Physically Abused: No   Sexually Abused: No    Review of Systems Per HPI.   Objective:   Vitals:   12/25/21 1343  BP: 116/70  Pulse: 65  Resp: 15  Temp: 97.9 F (36.6 C)  TempSrc: Temporal  SpO2: 97%  Weight: 142 lb 3.2 oz (64.5 kg)  Height: '5\' 6"'$  (1.676 m)     Physical Exam Vitals reviewed.  Constitutional:      Appearance: Normal appearance. She is well-developed.  HENT:     Head: Normocephalic and atraumatic.  Eyes:     Conjunctiva/sclera: Conjunctivae normal.     Pupils: Pupils are equal, round, and reactive to light.  Neck:     Vascular: No carotid bruit.  Cardiovascular:     Rate and Rhythm: Normal rate and regular rhythm.     Heart sounds: Normal heart sounds.  Pulmonary:     Effort: Pulmonary effort is normal.     Breath  sounds: Normal breath sounds.  Abdominal:     Palpations: Abdomen is soft. There is no pulsatile mass.  Musculoskeletal:     Right lower leg: No edema.     Left lower leg: No edema.  Skin:    General: Skin is warm and dry.  Neurological:     Mental Status: She is alert and oriented  to person, place, and time.  Psychiatric:        Mood and Affect: Mood normal.        Behavior: Behavior normal.       Assessment & Plan:  Sylvia Bowen is a 76 y.o. female . Hyperlipidemia, unspecified hyperlipidemia type - Plan: rosuvastatin (CRESTOR) 5 MG tablet  -Significant elevated LDL as above.  Unsure of which previous statin was not tolerated, unknown dose.  We will try low-dose Crestor 5 mg, initially once per week and slowly increase as tolerated.  If recurrent of myalgias, stop med and refer to lipid clinic.  May be candidate for PCSK9 inhibitor.  Dizziness  -Improved, may have been a component of allergies versus prior illness, stress/anxiety component as above possible as well which is now managed better.  RTC precautions.  Cold sore - Plan: valACYclovir (VALTREX) 500 MG tablet  -With intolerance to higher dose Valtrex, trial of 500 mg twice daily for flares.  RTC precautions if this also causes side effects. Consider 500 mg daily if frequent flares and tolerating the 500 mg dose.  Meds ordered this encounter  Medications   rosuvastatin (CRESTOR) 5 MG tablet    Sig: Take 1 tablet (5 mg total) by mouth daily. Start once per week, then increase to daily as tolerated.    Dispense:  90 tablet    Refill:  1   valACYclovir (VALTREX) 500 MG tablet    Sig: Take 1 tablet (500 mg total) by mouth 2 (two) times daily. For 3 days with flare.    Dispense:  30 tablet    Refill:  1   Patient Instructions  Try lower dose of valtrex 2 times per day for 3 days at time of flare. If tolerated and frequent flares, we cna look at daily dosing.   Start crestor once per week and increase as tolerated up  to once per day. Recheck in 2 months.   Glad to hear dizziness improved - follow up if not continuing to improve.   Return to the clinic or go to the nearest emergency room if any of your symptoms worsen, or new symptoms occur.     Signed,   Merri Ray, MD [place] 12/25/21 2:29 PM

## 2021-12-25 NOTE — Patient Instructions (Addendum)
Try lower dose of valtrex 2 times per day for 3 days at time of flare. If tolerated and frequent flares, we cna look at daily dosing.  ? ?Start crestor once per week and increase as tolerated up to once per day. Recheck in 2 months.  ? ?Glad to hear dizziness improved - follow up if not continuing to improve.  ? ?Return to the clinic or go to the nearest emergency room if any of your symptoms worsen, or new symptoms occur. ? ?

## 2021-12-26 DIAGNOSIS — M9905 Segmental and somatic dysfunction of pelvic region: Secondary | ICD-10-CM | POA: Diagnosis not present

## 2021-12-26 DIAGNOSIS — M9901 Segmental and somatic dysfunction of cervical region: Secondary | ICD-10-CM | POA: Diagnosis not present

## 2021-12-26 DIAGNOSIS — M9904 Segmental and somatic dysfunction of sacral region: Secondary | ICD-10-CM | POA: Diagnosis not present

## 2021-12-26 DIAGNOSIS — M531 Cervicobrachial syndrome: Secondary | ICD-10-CM | POA: Diagnosis not present

## 2022-01-01 ENCOUNTER — Encounter: Payer: Self-pay | Admitting: Family Medicine

## 2022-01-08 ENCOUNTER — Other Ambulatory Visit: Payer: Self-pay | Admitting: Family Medicine

## 2022-01-08 DIAGNOSIS — E785 Hyperlipidemia, unspecified: Secondary | ICD-10-CM

## 2022-01-13 ENCOUNTER — Encounter: Payer: Self-pay | Admitting: Family Medicine

## 2022-01-13 DIAGNOSIS — M531 Cervicobrachial syndrome: Secondary | ICD-10-CM | POA: Diagnosis not present

## 2022-01-13 DIAGNOSIS — M9901 Segmental and somatic dysfunction of cervical region: Secondary | ICD-10-CM | POA: Diagnosis not present

## 2022-01-13 DIAGNOSIS — M9904 Segmental and somatic dysfunction of sacral region: Secondary | ICD-10-CM | POA: Diagnosis not present

## 2022-01-13 DIAGNOSIS — M9905 Segmental and somatic dysfunction of pelvic region: Secondary | ICD-10-CM | POA: Diagnosis not present

## 2022-01-23 ENCOUNTER — Ambulatory Visit: Payer: Medicare PPO

## 2022-01-23 ENCOUNTER — Ambulatory Visit
Admission: RE | Admit: 2022-01-23 | Discharge: 2022-01-23 | Disposition: A | Discharge status: Home / Self Care | Payer: Medicare PPO | Source: Ambulatory Visit | Attending: Family Medicine | Admitting: Family Medicine

## 2022-01-23 DIAGNOSIS — M810 Age-related osteoporosis without current pathological fracture: Secondary | ICD-10-CM

## 2022-01-23 DIAGNOSIS — Z780 Asymptomatic menopausal state: Secondary | ICD-10-CM | POA: Diagnosis not present

## 2022-01-27 DIAGNOSIS — M9905 Segmental and somatic dysfunction of pelvic region: Secondary | ICD-10-CM | POA: Diagnosis not present

## 2022-01-27 DIAGNOSIS — M531 Cervicobrachial syndrome: Secondary | ICD-10-CM | POA: Diagnosis not present

## 2022-01-27 DIAGNOSIS — M9904 Segmental and somatic dysfunction of sacral region: Secondary | ICD-10-CM | POA: Diagnosis not present

## 2022-01-27 DIAGNOSIS — M9901 Segmental and somatic dysfunction of cervical region: Secondary | ICD-10-CM | POA: Diagnosis not present

## 2022-01-29 DIAGNOSIS — H25043 Posterior subcapsular polar age-related cataract, bilateral: Secondary | ICD-10-CM | POA: Diagnosis not present

## 2022-01-29 DIAGNOSIS — H25013 Cortical age-related cataract, bilateral: Secondary | ICD-10-CM | POA: Diagnosis not present

## 2022-01-29 DIAGNOSIS — H2511 Age-related nuclear cataract, right eye: Secondary | ICD-10-CM | POA: Diagnosis not present

## 2022-01-29 DIAGNOSIS — H18413 Arcus senilis, bilateral: Secondary | ICD-10-CM | POA: Diagnosis not present

## 2022-01-29 DIAGNOSIS — H2513 Age-related nuclear cataract, bilateral: Secondary | ICD-10-CM | POA: Diagnosis not present

## 2022-02-06 DIAGNOSIS — M531 Cervicobrachial syndrome: Secondary | ICD-10-CM | POA: Diagnosis not present

## 2022-02-06 DIAGNOSIS — M9901 Segmental and somatic dysfunction of cervical region: Secondary | ICD-10-CM | POA: Diagnosis not present

## 2022-02-06 DIAGNOSIS — M9904 Segmental and somatic dysfunction of sacral region: Secondary | ICD-10-CM | POA: Diagnosis not present

## 2022-02-06 DIAGNOSIS — M9905 Segmental and somatic dysfunction of pelvic region: Secondary | ICD-10-CM | POA: Diagnosis not present

## 2022-02-20 DIAGNOSIS — M9905 Segmental and somatic dysfunction of pelvic region: Secondary | ICD-10-CM | POA: Diagnosis not present

## 2022-02-20 DIAGNOSIS — M531 Cervicobrachial syndrome: Secondary | ICD-10-CM | POA: Diagnosis not present

## 2022-02-20 DIAGNOSIS — M9904 Segmental and somatic dysfunction of sacral region: Secondary | ICD-10-CM | POA: Diagnosis not present

## 2022-02-20 DIAGNOSIS — M9901 Segmental and somatic dysfunction of cervical region: Secondary | ICD-10-CM | POA: Diagnosis not present

## 2022-02-26 ENCOUNTER — Ambulatory Visit: Payer: Medicare PPO | Admitting: Family Medicine

## 2022-03-05 DIAGNOSIS — M531 Cervicobrachial syndrome: Secondary | ICD-10-CM | POA: Diagnosis not present

## 2022-03-05 DIAGNOSIS — M9905 Segmental and somatic dysfunction of pelvic region: Secondary | ICD-10-CM | POA: Diagnosis not present

## 2022-03-05 DIAGNOSIS — M9904 Segmental and somatic dysfunction of sacral region: Secondary | ICD-10-CM | POA: Diagnosis not present

## 2022-03-05 DIAGNOSIS — M9901 Segmental and somatic dysfunction of cervical region: Secondary | ICD-10-CM | POA: Diagnosis not present

## 2022-03-06 ENCOUNTER — Other Ambulatory Visit: Payer: Self-pay | Admitting: Family Medicine

## 2022-03-06 DIAGNOSIS — H2512 Age-related nuclear cataract, left eye: Secondary | ICD-10-CM | POA: Diagnosis not present

## 2022-03-06 DIAGNOSIS — B001 Herpesviral vesicular dermatitis: Secondary | ICD-10-CM

## 2022-03-06 DIAGNOSIS — H2511 Age-related nuclear cataract, right eye: Secondary | ICD-10-CM | POA: Diagnosis not present

## 2022-03-19 DIAGNOSIS — M9901 Segmental and somatic dysfunction of cervical region: Secondary | ICD-10-CM | POA: Diagnosis not present

## 2022-03-19 DIAGNOSIS — M531 Cervicobrachial syndrome: Secondary | ICD-10-CM | POA: Diagnosis not present

## 2022-03-19 DIAGNOSIS — M9905 Segmental and somatic dysfunction of pelvic region: Secondary | ICD-10-CM | POA: Diagnosis not present

## 2022-03-19 DIAGNOSIS — M9904 Segmental and somatic dysfunction of sacral region: Secondary | ICD-10-CM | POA: Diagnosis not present

## 2022-03-23 DIAGNOSIS — H2512 Age-related nuclear cataract, left eye: Secondary | ICD-10-CM | POA: Diagnosis not present

## 2022-03-27 DIAGNOSIS — H2512 Age-related nuclear cataract, left eye: Secondary | ICD-10-CM | POA: Diagnosis not present

## 2022-04-02 DIAGNOSIS — M9901 Segmental and somatic dysfunction of cervical region: Secondary | ICD-10-CM | POA: Diagnosis not present

## 2022-04-02 DIAGNOSIS — M531 Cervicobrachial syndrome: Secondary | ICD-10-CM | POA: Diagnosis not present

## 2022-04-02 DIAGNOSIS — M9904 Segmental and somatic dysfunction of sacral region: Secondary | ICD-10-CM | POA: Diagnosis not present

## 2022-04-02 DIAGNOSIS — M9905 Segmental and somatic dysfunction of pelvic region: Secondary | ICD-10-CM | POA: Diagnosis not present

## 2022-04-06 ENCOUNTER — Ambulatory Visit: Payer: Medicare PPO | Admitting: Dermatology

## 2022-04-06 DIAGNOSIS — L739 Follicular disorder, unspecified: Secondary | ICD-10-CM | POA: Diagnosis not present

## 2022-04-06 DIAGNOSIS — D485 Neoplasm of uncertain behavior of skin: Secondary | ICD-10-CM

## 2022-04-06 DIAGNOSIS — L821 Other seborrheic keratosis: Secondary | ICD-10-CM | POA: Diagnosis not present

## 2022-04-06 DIAGNOSIS — L814 Other melanin hyperpigmentation: Secondary | ICD-10-CM

## 2022-04-06 NOTE — Patient Instructions (Signed)

## 2022-04-16 DIAGNOSIS — M531 Cervicobrachial syndrome: Secondary | ICD-10-CM | POA: Diagnosis not present

## 2022-04-16 DIAGNOSIS — M9905 Segmental and somatic dysfunction of pelvic region: Secondary | ICD-10-CM | POA: Diagnosis not present

## 2022-04-16 DIAGNOSIS — M9901 Segmental and somatic dysfunction of cervical region: Secondary | ICD-10-CM | POA: Diagnosis not present

## 2022-04-16 DIAGNOSIS — M9904 Segmental and somatic dysfunction of sacral region: Secondary | ICD-10-CM | POA: Diagnosis not present

## 2022-04-25 DIAGNOSIS — H2000 Unspecified acute and subacute iridocyclitis: Secondary | ICD-10-CM | POA: Diagnosis not present

## 2022-04-28 ENCOUNTER — Telehealth: Payer: Self-pay | Admitting: Dermatology

## 2022-04-28 NOTE — Telephone Encounter (Signed)
Results, ST 

## 2022-04-28 NOTE — Telephone Encounter (Signed)
Path to patient

## 2022-04-29 ENCOUNTER — Encounter: Payer: Self-pay | Admitting: Dermatology

## 2022-04-29 NOTE — Progress Notes (Signed)
   Follow-Up Visit   Subjective  Sylvia Bowen is a 76 y.o. female who presents for the following: Skin Problem (Patient here today to have a lesion rechecked on her right temple that's getting larger. Per patient no bleeding, no pain. ).  Enlarging spot right temple, also check pigmentation on upper nose growth on back Location:  Duration:  Quality:  Associated Signs/Symptoms: Modifying Factors:  Severity:  Timing: Context:   Objective  Well appearing patient in no apparent distress; mood and affect are within normal limits. Right Temple 5 mm pink pearly 9 mm papule, rule out BCC       Right Root of Nose 6 mm monochrome tan symmetric macule, no dermoscopic atypia  Right Upper Back Textured 6 mm brown flattopped papule, typical dermoscopy    All sun exposed areas plus back examined.   Assessment & Plan    Neoplasm of uncertain behavior of skin Right Temple  Skin / nail biopsy Type of biopsy: tangential   Informed consent: discussed and consent obtained   Timeout: patient name, date of birth, surgical site, and procedure verified   Anesthesia: the lesion was anesthetized in a standard fashion   Anesthetic:  1% lidocaine w/ epinephrine 1-100,000 local infiltration Instrument used: flexible razor blade   Hemostasis achieved with: ferric subsulfate and electrodesiccation   Outcome: patient tolerated procedure well   Post-procedure details: sterile dressing applied and wound care instructions given   Dressing type: bandage and petrolatum    Specimen 1 - Surgical pathology Differential Diagnosis: R/O BCC vs SCC - cautery after biopsy  Check Margins: No  Solar lentigo Right Root of Nose  Recheck if there is clinical change  Seborrheic keratosis Right Upper Back  Leave if stable      I, Lavonna Monarch, MD, have reviewed all documentation for this visit.  The documentation on 04/29/22 for the exam, diagnosis, procedures, and orders are all accurate and  complete.

## 2022-04-30 DIAGNOSIS — M531 Cervicobrachial syndrome: Secondary | ICD-10-CM | POA: Diagnosis not present

## 2022-04-30 DIAGNOSIS — M9901 Segmental and somatic dysfunction of cervical region: Secondary | ICD-10-CM | POA: Diagnosis not present

## 2022-04-30 DIAGNOSIS — M9905 Segmental and somatic dysfunction of pelvic region: Secondary | ICD-10-CM | POA: Diagnosis not present

## 2022-04-30 DIAGNOSIS — M9904 Segmental and somatic dysfunction of sacral region: Secondary | ICD-10-CM | POA: Diagnosis not present

## 2022-05-14 DIAGNOSIS — M9905 Segmental and somatic dysfunction of pelvic region: Secondary | ICD-10-CM | POA: Diagnosis not present

## 2022-05-14 DIAGNOSIS — M9904 Segmental and somatic dysfunction of sacral region: Secondary | ICD-10-CM | POA: Diagnosis not present

## 2022-05-14 DIAGNOSIS — M531 Cervicobrachial syndrome: Secondary | ICD-10-CM | POA: Diagnosis not present

## 2022-05-14 DIAGNOSIS — M9901 Segmental and somatic dysfunction of cervical region: Secondary | ICD-10-CM | POA: Diagnosis not present

## 2022-05-29 DIAGNOSIS — M9901 Segmental and somatic dysfunction of cervical region: Secondary | ICD-10-CM | POA: Diagnosis not present

## 2022-05-29 DIAGNOSIS — M531 Cervicobrachial syndrome: Secondary | ICD-10-CM | POA: Diagnosis not present

## 2022-05-29 DIAGNOSIS — M9905 Segmental and somatic dysfunction of pelvic region: Secondary | ICD-10-CM | POA: Diagnosis not present

## 2022-05-29 DIAGNOSIS — M9904 Segmental and somatic dysfunction of sacral region: Secondary | ICD-10-CM | POA: Diagnosis not present

## 2022-05-30 DIAGNOSIS — S0502XA Injury of conjunctiva and corneal abrasion without foreign body, left eye, initial encounter: Secondary | ICD-10-CM | POA: Diagnosis not present

## 2022-06-01 DIAGNOSIS — S0502XA Injury of conjunctiva and corneal abrasion without foreign body, left eye, initial encounter: Secondary | ICD-10-CM | POA: Diagnosis not present

## 2022-06-01 DIAGNOSIS — H18593 Other hereditary corneal dystrophies, bilateral: Secondary | ICD-10-CM | POA: Diagnosis not present

## 2022-06-10 DIAGNOSIS — M9905 Segmental and somatic dysfunction of pelvic region: Secondary | ICD-10-CM | POA: Diagnosis not present

## 2022-06-10 DIAGNOSIS — M531 Cervicobrachial syndrome: Secondary | ICD-10-CM | POA: Diagnosis not present

## 2022-06-10 DIAGNOSIS — M9904 Segmental and somatic dysfunction of sacral region: Secondary | ICD-10-CM | POA: Diagnosis not present

## 2022-06-10 DIAGNOSIS — M9901 Segmental and somatic dysfunction of cervical region: Secondary | ICD-10-CM | POA: Diagnosis not present

## 2022-06-24 DIAGNOSIS — M9904 Segmental and somatic dysfunction of sacral region: Secondary | ICD-10-CM | POA: Diagnosis not present

## 2022-06-24 DIAGNOSIS — M9901 Segmental and somatic dysfunction of cervical region: Secondary | ICD-10-CM | POA: Diagnosis not present

## 2022-06-24 DIAGNOSIS — M9905 Segmental and somatic dysfunction of pelvic region: Secondary | ICD-10-CM | POA: Diagnosis not present

## 2022-06-24 DIAGNOSIS — M531 Cervicobrachial syndrome: Secondary | ICD-10-CM | POA: Diagnosis not present

## 2022-07-08 DIAGNOSIS — M9904 Segmental and somatic dysfunction of sacral region: Secondary | ICD-10-CM | POA: Diagnosis not present

## 2022-07-08 DIAGNOSIS — M9905 Segmental and somatic dysfunction of pelvic region: Secondary | ICD-10-CM | POA: Diagnosis not present

## 2022-07-08 DIAGNOSIS — M9901 Segmental and somatic dysfunction of cervical region: Secondary | ICD-10-CM | POA: Diagnosis not present

## 2022-07-08 DIAGNOSIS — M531 Cervicobrachial syndrome: Secondary | ICD-10-CM | POA: Diagnosis not present

## 2022-07-08 DIAGNOSIS — Z119 Encounter for screening for infectious and parasitic diseases, unspecified: Secondary | ICD-10-CM | POA: Diagnosis not present

## 2022-07-22 DIAGNOSIS — M9901 Segmental and somatic dysfunction of cervical region: Secondary | ICD-10-CM | POA: Diagnosis not present

## 2022-07-22 DIAGNOSIS — M531 Cervicobrachial syndrome: Secondary | ICD-10-CM | POA: Diagnosis not present

## 2022-07-22 DIAGNOSIS — M9905 Segmental and somatic dysfunction of pelvic region: Secondary | ICD-10-CM | POA: Diagnosis not present

## 2022-07-22 DIAGNOSIS — M9904 Segmental and somatic dysfunction of sacral region: Secondary | ICD-10-CM | POA: Diagnosis not present

## 2022-07-28 ENCOUNTER — Ambulatory Visit
Admission: RE | Admit: 2022-07-28 | Discharge: 2022-07-28 | Disposition: A | Discharge status: Home / Self Care | Payer: Medicare PPO | Source: Ambulatory Visit | Attending: Family Medicine | Admitting: Family Medicine

## 2022-07-28 DIAGNOSIS — Z1231 Encounter for screening mammogram for malignant neoplasm of breast: Secondary | ICD-10-CM

## 2022-08-04 ENCOUNTER — Ambulatory Visit: Payer: Medicare PPO | Admitting: Dermatology

## 2022-08-05 DIAGNOSIS — M9901 Segmental and somatic dysfunction of cervical region: Secondary | ICD-10-CM | POA: Diagnosis not present

## 2022-08-05 DIAGNOSIS — M9904 Segmental and somatic dysfunction of sacral region: Secondary | ICD-10-CM | POA: Diagnosis not present

## 2022-08-05 DIAGNOSIS — M531 Cervicobrachial syndrome: Secondary | ICD-10-CM | POA: Diagnosis not present

## 2022-08-05 DIAGNOSIS — M9905 Segmental and somatic dysfunction of pelvic region: Secondary | ICD-10-CM | POA: Diagnosis not present

## 2022-08-24 DIAGNOSIS — M9905 Segmental and somatic dysfunction of pelvic region: Secondary | ICD-10-CM | POA: Diagnosis not present

## 2022-08-24 DIAGNOSIS — M9901 Segmental and somatic dysfunction of cervical region: Secondary | ICD-10-CM | POA: Diagnosis not present

## 2022-08-24 DIAGNOSIS — M531 Cervicobrachial syndrome: Secondary | ICD-10-CM | POA: Diagnosis not present

## 2022-08-24 DIAGNOSIS — M9904 Segmental and somatic dysfunction of sacral region: Secondary | ICD-10-CM | POA: Diagnosis not present

## 2022-09-02 DIAGNOSIS — M9901 Segmental and somatic dysfunction of cervical region: Secondary | ICD-10-CM | POA: Diagnosis not present

## 2022-09-02 DIAGNOSIS — M531 Cervicobrachial syndrome: Secondary | ICD-10-CM | POA: Diagnosis not present

## 2022-09-02 DIAGNOSIS — M9904 Segmental and somatic dysfunction of sacral region: Secondary | ICD-10-CM | POA: Diagnosis not present

## 2022-09-02 DIAGNOSIS — M9905 Segmental and somatic dysfunction of pelvic region: Secondary | ICD-10-CM | POA: Diagnosis not present

## 2022-09-22 DIAGNOSIS — M9904 Segmental and somatic dysfunction of sacral region: Secondary | ICD-10-CM | POA: Diagnosis not present

## 2022-09-22 DIAGNOSIS — M9901 Segmental and somatic dysfunction of cervical region: Secondary | ICD-10-CM | POA: Diagnosis not present

## 2022-09-22 DIAGNOSIS — M9905 Segmental and somatic dysfunction of pelvic region: Secondary | ICD-10-CM | POA: Diagnosis not present

## 2022-09-22 DIAGNOSIS — M531 Cervicobrachial syndrome: Secondary | ICD-10-CM | POA: Diagnosis not present

## 2022-09-29 DIAGNOSIS — M9901 Segmental and somatic dysfunction of cervical region: Secondary | ICD-10-CM | POA: Diagnosis not present

## 2022-09-29 DIAGNOSIS — M9905 Segmental and somatic dysfunction of pelvic region: Secondary | ICD-10-CM | POA: Diagnosis not present

## 2022-09-29 DIAGNOSIS — M9904 Segmental and somatic dysfunction of sacral region: Secondary | ICD-10-CM | POA: Diagnosis not present

## 2022-09-29 DIAGNOSIS — M531 Cervicobrachial syndrome: Secondary | ICD-10-CM | POA: Diagnosis not present

## 2022-10-13 DIAGNOSIS — M9901 Segmental and somatic dysfunction of cervical region: Secondary | ICD-10-CM | POA: Diagnosis not present

## 2022-10-13 DIAGNOSIS — M9904 Segmental and somatic dysfunction of sacral region: Secondary | ICD-10-CM | POA: Diagnosis not present

## 2022-10-13 DIAGNOSIS — M531 Cervicobrachial syndrome: Secondary | ICD-10-CM | POA: Diagnosis not present

## 2022-10-13 DIAGNOSIS — M9905 Segmental and somatic dysfunction of pelvic region: Secondary | ICD-10-CM | POA: Diagnosis not present

## 2022-10-27 DIAGNOSIS — M9901 Segmental and somatic dysfunction of cervical region: Secondary | ICD-10-CM | POA: Diagnosis not present

## 2022-10-27 DIAGNOSIS — M9904 Segmental and somatic dysfunction of sacral region: Secondary | ICD-10-CM | POA: Diagnosis not present

## 2022-10-27 DIAGNOSIS — M9905 Segmental and somatic dysfunction of pelvic region: Secondary | ICD-10-CM | POA: Diagnosis not present

## 2022-10-27 DIAGNOSIS — M531 Cervicobrachial syndrome: Secondary | ICD-10-CM | POA: Diagnosis not present

## 2022-11-10 DIAGNOSIS — M9901 Segmental and somatic dysfunction of cervical region: Secondary | ICD-10-CM | POA: Diagnosis not present

## 2022-11-10 DIAGNOSIS — M531 Cervicobrachial syndrome: Secondary | ICD-10-CM | POA: Diagnosis not present

## 2022-11-10 DIAGNOSIS — M9905 Segmental and somatic dysfunction of pelvic region: Secondary | ICD-10-CM | POA: Diagnosis not present

## 2022-11-10 DIAGNOSIS — M9904 Segmental and somatic dysfunction of sacral region: Secondary | ICD-10-CM | POA: Diagnosis not present

## 2022-11-24 DIAGNOSIS — Z119 Encounter for screening for infectious and parasitic diseases, unspecified: Secondary | ICD-10-CM | POA: Diagnosis not present

## 2022-11-24 DIAGNOSIS — Z110 Encounter for screening for intestinal infectious diseases: Secondary | ICD-10-CM | POA: Diagnosis not present

## 2022-11-24 DIAGNOSIS — M531 Cervicobrachial syndrome: Secondary | ICD-10-CM | POA: Diagnosis not present

## 2022-11-24 DIAGNOSIS — M9901 Segmental and somatic dysfunction of cervical region: Secondary | ICD-10-CM | POA: Diagnosis not present

## 2022-11-24 DIAGNOSIS — M9905 Segmental and somatic dysfunction of pelvic region: Secondary | ICD-10-CM | POA: Diagnosis not present

## 2022-11-24 DIAGNOSIS — M9904 Segmental and somatic dysfunction of sacral region: Secondary | ICD-10-CM | POA: Diagnosis not present

## 2022-12-22 DIAGNOSIS — M9901 Segmental and somatic dysfunction of cervical region: Secondary | ICD-10-CM | POA: Diagnosis not present

## 2022-12-22 DIAGNOSIS — M9904 Segmental and somatic dysfunction of sacral region: Secondary | ICD-10-CM | POA: Diagnosis not present

## 2022-12-22 DIAGNOSIS — M9905 Segmental and somatic dysfunction of pelvic region: Secondary | ICD-10-CM | POA: Diagnosis not present

## 2022-12-22 DIAGNOSIS — M531 Cervicobrachial syndrome: Secondary | ICD-10-CM | POA: Diagnosis not present

## 2023-01-12 DIAGNOSIS — M9901 Segmental and somatic dysfunction of cervical region: Secondary | ICD-10-CM | POA: Diagnosis not present

## 2023-01-12 DIAGNOSIS — M9905 Segmental and somatic dysfunction of pelvic region: Secondary | ICD-10-CM | POA: Diagnosis not present

## 2023-01-12 DIAGNOSIS — M9904 Segmental and somatic dysfunction of sacral region: Secondary | ICD-10-CM | POA: Diagnosis not present

## 2023-01-12 DIAGNOSIS — M531 Cervicobrachial syndrome: Secondary | ICD-10-CM | POA: Diagnosis not present

## 2023-01-26 DIAGNOSIS — M9905 Segmental and somatic dysfunction of pelvic region: Secondary | ICD-10-CM | POA: Diagnosis not present

## 2023-01-26 DIAGNOSIS — M9901 Segmental and somatic dysfunction of cervical region: Secondary | ICD-10-CM | POA: Diagnosis not present

## 2023-01-26 DIAGNOSIS — M9904 Segmental and somatic dysfunction of sacral region: Secondary | ICD-10-CM | POA: Diagnosis not present

## 2023-01-26 DIAGNOSIS — M531 Cervicobrachial syndrome: Secondary | ICD-10-CM | POA: Diagnosis not present

## 2023-02-08 DIAGNOSIS — M9904 Segmental and somatic dysfunction of sacral region: Secondary | ICD-10-CM | POA: Diagnosis not present

## 2023-02-08 DIAGNOSIS — M9905 Segmental and somatic dysfunction of pelvic region: Secondary | ICD-10-CM | POA: Diagnosis not present

## 2023-02-08 DIAGNOSIS — M531 Cervicobrachial syndrome: Secondary | ICD-10-CM | POA: Diagnosis not present

## 2023-02-08 DIAGNOSIS — M9901 Segmental and somatic dysfunction of cervical region: Secondary | ICD-10-CM | POA: Diagnosis not present

## 2023-02-23 DIAGNOSIS — M9904 Segmental and somatic dysfunction of sacral region: Secondary | ICD-10-CM | POA: Diagnosis not present

## 2023-02-23 DIAGNOSIS — M9905 Segmental and somatic dysfunction of pelvic region: Secondary | ICD-10-CM | POA: Diagnosis not present

## 2023-02-23 DIAGNOSIS — M531 Cervicobrachial syndrome: Secondary | ICD-10-CM | POA: Diagnosis not present

## 2023-02-23 DIAGNOSIS — M9901 Segmental and somatic dysfunction of cervical region: Secondary | ICD-10-CM | POA: Diagnosis not present

## 2023-03-09 DIAGNOSIS — M531 Cervicobrachial syndrome: Secondary | ICD-10-CM | POA: Diagnosis not present

## 2023-03-09 DIAGNOSIS — M9901 Segmental and somatic dysfunction of cervical region: Secondary | ICD-10-CM | POA: Diagnosis not present

## 2023-03-09 DIAGNOSIS — M9905 Segmental and somatic dysfunction of pelvic region: Secondary | ICD-10-CM | POA: Diagnosis not present

## 2023-03-09 DIAGNOSIS — M9904 Segmental and somatic dysfunction of sacral region: Secondary | ICD-10-CM | POA: Diagnosis not present

## 2023-03-25 ENCOUNTER — Encounter: Payer: Self-pay | Admitting: Family Medicine

## 2023-03-25 ENCOUNTER — Ambulatory Visit (INDEPENDENT_AMBULATORY_CARE_PROVIDER_SITE_OTHER): Payer: Medicare PPO | Admitting: Family Medicine

## 2023-03-25 VITALS — BP 117/68 | HR 90 | Temp 97.9°F | Ht 66.0 in | Wt 142.6 lb

## 2023-03-25 DIAGNOSIS — M810 Age-related osteoporosis without current pathological fracture: Secondary | ICD-10-CM | POA: Diagnosis not present

## 2023-03-25 DIAGNOSIS — Z0001 Encounter for general adult medical examination with abnormal findings: Secondary | ICD-10-CM

## 2023-03-25 DIAGNOSIS — Z0000 Encounter for general adult medical examination without abnormal findings: Secondary | ICD-10-CM

## 2023-03-25 NOTE — Progress Notes (Signed)
BP 117/68   Pulse 90   Temp 97.9 F (36.6 C) (Temporal)   Ht 5\' 6"  (1.676 m)   Wt 142 lb 9.6 oz (64.7 kg)   SpO2 97%   BMI 23.02 kg/m    Subjective:    Patient ID: Sylvia Bowen, female    DOB: 12-23-1945, 77 y.o.   MRN: [id number]  HPI: Sylvia Bowen is a 77 y.o. female presenting on 03/25/2023 for New Patient (Initial Visit) (/), Headache, and Fatigue (/)   HPI Adult physical exam Patient denies any chest pain, shortness of breath, headaches or vision issues, abdominal complaints, diarrhea, nausea, vomiting, or joint issues.  She has mild headache and fatigue from a trip that she feels little worn down from but no major other symptoms and will keep an eye on it for now.  Osteoporosis/osteopenia Fractures or history of fracture: none Medication: Vitamin D and calcium Duration of treatment: 1 year Last bone density scan: 2023 Last T score: -3.9   Relevant past medical, surgical, family and social history reviewed and updated as indicated. Interim medical history since our last visit reviewed. Allergies and medications reviewed and updated.  Review of Systems  Constitutional:  Negative for chills and fever.  HENT:  Negative for congestion, ear discharge, ear pain and tinnitus.   Eyes:  Negative for pain, redness and visual disturbance.  Respiratory:  Negative for cough, chest tightness, shortness of breath and wheezing.   Cardiovascular:  Negative for chest pain, palpitations and leg swelling.  Gastrointestinal:  Negative for abdominal pain, blood in stool, constipation and diarrhea.  Genitourinary:  Negative for difficulty urinating, dysuria and hematuria.  Musculoskeletal:  Negative for back pain, gait problem and myalgias.  Skin:  Negative for rash.  Neurological:  Negative for dizziness, weakness, light-headedness and headaches.  Psychiatric/Behavioral:  Negative for agitation, behavioral problems and suicidal ideas.   All other systems reviewed and are  negative.   Per HPI unless specifically indicated above  Social History   Socioeconomic History   Marital status: Married    Spouse name: Not on file   Number of children: Not on file   Years of education: Not on file   Highest education level: Not on file  Occupational History   Not on file  Tobacco Use   Smoking status: Former    Packs/day: 0.50    Years: 2.00    Additional pack years: 0.00    Total pack years: 1.00    Types: Cigarettes    Quit date: 17    Years since quitting: 54.4   Smokeless tobacco: Never  Vaping Use   Vaping Use: Never used  Substance and Sexual Activity   Alcohol use: Not Currently   Drug use: Never   Sexual activity: Not Currently    Birth control/protection: Surgical  Other Topics Concern   Not on file  Social History Narrative   Not on file   Social Determinants of Health   Financial Resource Strain: Low Risk  (11/13/2021)   Overall Financial Resource Strain (CARDIA)    Difficulty of Paying Living Expenses: Not hard at all  Food Insecurity: No Food Insecurity (11/13/2021)   Hunger Vital Sign    Worried About Running Out of Food in the Last Year: Never true    Ran Out of Food in the Last Year: Never true  Transportation Needs: No Transportation Needs (11/13/2021)   PRAPARE - Transportation    Lack of Transportation (Medical): No    Lack of  Transportation (Non-Medical): No  Physical Activity: Insufficiently Active (11/13/2021)   Exercise Vital Sign    Days of Exercise per Week: 5 days    Minutes of Exercise per Session: 20 min  Stress: No Stress Concern Present (11/13/2021)   [employer] of Occupational Health - Occupational Stress Questionnaire    Feeling of Stress : Not at all  Social Connections: Socially Integrated (11/13/2021)   Social Connection and Isolation Panel [NHANES]    Frequency of Communication with Friends and Family: Twice a week    Frequency of Social Gatherings with Friends and Family: Twice a week    Attends  Religious Services: More than 4 times per year    Active Member of [employer] or Organizations: Yes    Attends Banker Meetings: More than 4 times per year    Marital Status: Married  Catering manager Violence: Not At Risk (11/13/2021)   Humiliation, Afraid, Rape, and Kick questionnaire    Fear of Current or Ex-Partner: No    Emotionally Abused: No    Physically Abused: No    Sexually Abused: No    Past Surgical History:  Procedure Laterality Date   ABDOMINAL HYSTERECTOMY  2012   DILATION AND CURETTAGE OF UTERUS  1983   KNEE ARTHROSCOPY  2015    Family History  Problem Relation Age of Onset   Arthritis Mother    Hearing loss Mother    Miscarriages / [place] Mother    Stroke Mother    Cancer Father    Heart attack Father    Stroke Father    Cancer Brother 26       colon   Arthritis Brother    Stroke Maternal Grandmother    Stroke Maternal Grandfather    Stroke Paternal Grandmother    Stroke Paternal Grandfather    Stroke Son     Allergies as of 03/25/2023       Reactions   Clindamycin Rash   Erythromycin Base Rash   Levofloxacin Rash        Medication List        Accurate as of March 25, 2023  2:03 PM. If you have any questions, ask your nurse or doctor.          STOP taking these medications    CALCIUM PO Stopped by: Nils Pyle, MD   Cholecalciferol 25 MCG (1000 UT) tablet Stopped by: Elige Radon Lc Joynt, MD   fluticasone 50 MCG/ACT nasal spray Commonly known as: FLONASE Stopped by: Elige Radon Nyima Vanacker, MD   ipratropium 0.06 % nasal spray Commonly known as: ATROVENT Stopped by: Elige Radon Mihran Lebarron, MD   ketorolac 0.5 % ophthalmic solution Commonly known as: ACULAR Stopped by: Elige Radon Jabes Primo, MD   moxifloxacin 0.5 % ophthalmic solution Commonly known as: VIGAMOX Stopped by: Nils Pyle, MD   OPTIVE 0.5-0.9 % ophthalmic solution Generic drug: carboxymethylcellul-glycerin Stopped by: Elige Radon Huan Pollok, MD    prednisoLONE acetate 1 % ophthalmic suspension Commonly known as: PRED FORTE Stopped by: Nils Pyle, MD   rosuvastatin 5 MG tablet Commonly known as: Crestor Stopped by: Nils Pyle, MD   TURMERIC PO Stopped by: Elige Radon Rene Gonsoulin, MD       TAKE these medications    MULTIVITAMIN ADULTS PO Take by mouth.   valACYclovir 500 MG tablet Commonly known as: VALTREX Take 1 tablet (500 mg total) by mouth 2 (two) times daily. For 3 days with flare.   VITAMIN D PO Take by mouth.  Objective:    BP 117/68   Pulse 90   Temp 97.9 F (36.6 C) (Temporal)   Ht 5\' 6"  (1.676 m)   Wt 142 lb 9.6 oz (64.7 kg)   SpO2 97%   BMI 23.02 kg/m   Wt Readings from Last 3 Encounters:  03/25/23 142 lb 9.6 oz (64.7 kg)  12/25/21 142 lb 3.2 oz (64.5 kg)  12/12/21 141 lb 12.8 oz (64.3 kg)    Physical Exam Vitals and nursing note reviewed.  Constitutional:      General: She is not in acute distress.    Appearance: She is well-developed. She is not diaphoretic.  HENT:     Right Ear: Tympanic membrane and ear canal normal.     Left Ear: Tympanic membrane and ear canal normal.     Mouth/Throat:     Mouth: Mucous membranes are moist.     Pharynx: Oropharynx is clear. No oropharyngeal exudate or posterior oropharyngeal erythema.  Eyes:     Conjunctiva/sclera: Conjunctivae normal.  Cardiovascular:     Rate and Rhythm: Normal rate and regular rhythm.     Heart sounds: Normal heart sounds. No murmur heard. Pulmonary:     Effort: Pulmonary effort is normal. No respiratory distress.     Breath sounds: Normal breath sounds. No wheezing.  Abdominal:     General: Bowel sounds are normal. There is no distension.     Palpations: Abdomen is soft.     Tenderness: There is no abdominal tenderness.  Musculoskeletal:        General: No swelling or tenderness. Normal range of motion.  Skin:    General: Skin is warm and dry.     Findings: No rash.  Neurological:     Mental  Status: She is alert and oriented to person, place, and time.     Coordination: Coordination normal.  Psychiatric:        Behavior: Behavior normal.         Assessment & Plan:   Problem List Items Addressed This Visit       Musculoskeletal and Integument   Osteoporosis   Relevant Orders   CBC with Differential/Platelet   CMP14+EGFR   Lipid panel   VITAMIN D 25 Hydroxy (Vit-D Deficiency, Fractures)   Other Visit Diagnoses     Annual physical exam    -  Primary   Relevant Orders   CBC with Differential/Platelet   CMP14+EGFR   Lipid panel   VITAMIN D 25 Hydroxy (Vit-D Deficiency, Fractures)     Continue current treatment, will see back in a year plan on doing bone density then.  Follow up plan: Return in about 1 year (around 03/24/2024), or if symptoms worsen or fail to improve, for Physical exam, also needs to schedule annual wellness.  Arville Care, MD [facility] 03/25/2023, 2:03 PM

## 2023-03-28 ENCOUNTER — Telehealth: Payer: Medicare PPO | Admitting: Family

## 2023-03-28 DIAGNOSIS — W57XXXA Bitten or stung by nonvenomous insect and other nonvenomous arthropods, initial encounter: Secondary | ICD-10-CM | POA: Diagnosis not present

## 2023-03-28 DIAGNOSIS — M791 Myalgia, unspecified site: Secondary | ICD-10-CM

## 2023-03-28 DIAGNOSIS — S80262A Insect bite (nonvenomous), left knee, initial encounter: Secondary | ICD-10-CM | POA: Diagnosis not present

## 2023-03-28 MED ORDER — DOXYCYCLINE HYCLATE 100 MG PO TABS
100.0000 mg | ORAL_TABLET | Freq: Two times a day (BID) | ORAL | 0 refills | Status: AC
Start: 2023-03-28 — End: ?

## 2023-03-28 NOTE — Progress Notes (Signed)
Virtual Visit Consent   Sylvia Bowen, you are scheduled for a virtual visit with a [facility] provider today. Just as with appointments in the office, your consent must be obtained to participate. Your consent will be active for this visit and any virtual visit you may have with one of our providers in the next 365 days. If you have a MyChart account, a copy of this consent can be sent to you electronically.  As this is a virtual visit, video technology does not allow for your provider to perform a traditional examination. This may limit your provider's ability to fully assess your condition. If your provider identifies any concerns that need to be evaluated in person or the need to arrange testing (such as labs, EKG, etc.), we will make arrangements to do so. Although advances in technology are sophisticated, we cannot ensure that it will always work on either your end or our end. If the connection with a video visit is poor, the visit may have to be switched to a telephone visit. With either a video or telephone visit, we are not always able to ensure that we have a secure connection.  By engaging in this virtual visit, you consent to the provision of healthcare and authorize for your insurance to be billed (if applicable) for the services provided during this visit. Depending on your insurance coverage, you may receive a charge related to this service.  I need to obtain your verbal consent now. Are you willing to proceed with your visit today? Sylvia Bowen has provided verbal consent on 03/28/2023 for a virtual visit (video or telephone). Sylvia Bowen  Date: 03/28/2023 4:29 PM  Virtual Visit via Video Note   I, Sylvia Bowen, connected with  Sylvia Bowen  ([id number], July 31, 1946) on 03/28/23 at  4:30 PM EDT by a video-enabled telemedicine application and verified that I am speaking with the correct person using two identifiers.  Location: Patient: Virtual Visit Location Patient:  Home Provider: Virtual Visit Location Provider: Home Office   I discussed the limitations of evaluation and management by telemedicine and the availability of in person appointments. The patient expressed understanding and agreed to proceed.    History of Present Illness: Sylvia Bowen is a 77 y.o. who identifies as a female who was assigned female at birth, and is being seen today for tick bite on posterior left knee that was removed today. Unsure how long he was attached, but reports started feeling bad four days ago. Reports she is having fever, body aches, headaches, and joint pain.  Reports a red rash around area, but no generalized rash. .   HPI: HPI  Problems:  Patient Active Problem List   Diagnosis Date Noted   Seasonal allergies 09/03/2020   Fever blister    History of colon polyps    Heart murmur    Leukopenia 08/14/2019   Recurrent herpes labialis 10/21/2017   Osteoporosis 11/09/2016   Cortical age-related cataract of both eyes 10/29/2016    Allergies:  Allergies  Allergen Reactions   Clindamycin Rash   Erythromycin Base Rash   Levofloxacin Rash   Medications:  Current Outpatient Medications:    doxycycline (VIBRA-TABS) 100 MG tablet, Take 1 tablet (100 mg total) by mouth 2 (two) times daily., Disp: 42 tablet, Rfl: 0   Multiple Vitamins-Minerals (MULTIVITAMIN ADULTS PO), Take by mouth., Disp: , Rfl:    valACYclovir (VALTREX) 500 MG tablet, Take 1 tablet (500 mg total) by mouth 2 (two) times  daily. For 3 days with flare., Disp: 30 tablet, Rfl: 1   VITAMIN D PO, Take by mouth., Disp: , Rfl:   Observations/Objective: Patient is well-developed, well-nourished in no acute distress.  Resting comfortably  at home.  Head is normocephalic, atraumatic.  No labored breathing.  Speech is clear and coherent with logical content.  Patient is alert and oriented at baseline.    Assessment and Plan: 1. Tick bite of left knee, initial encounter - doxycycline (VIBRA-TABS) 100  MG tablet; Take 1 tablet (100 mg total) by mouth 2 (two) times daily.  Dispense: 42 tablet; Refill: 0  2. Myalgia  -Pt to report any new fever, joint pain, or rash -Wear protective clothing while outside- Long sleeves and long pants -Put insect repellent on all exposed skin and along clothing -Take a shower as soon as possible after being outside Start doxycycline  Follow up with PCP this week   Follow Up Instructions: I discussed the assessment and treatment plan with the patient. The patient was provided an opportunity to ask questions and all were answered. The patient agreed with the plan and demonstrated an understanding of the instructions.  A copy of instructions were sent to the patient via MyChart unless otherwise noted below.     The patient was advised to call back or seek an in-person evaluation if the symptoms worsen or if the condition fails to improve as anticipated.  Time:  I spent 9 minutes with the patient via telehealth technology discussing the above problems/concerns.    Sylvia Bowen

## 2023-03-30 ENCOUNTER — Encounter: Payer: Self-pay | Admitting: Family Medicine

## 2023-03-30 ENCOUNTER — Ambulatory Visit (INDEPENDENT_AMBULATORY_CARE_PROVIDER_SITE_OTHER): Payer: Medicare PPO | Admitting: Family Medicine

## 2023-03-30 VITALS — BP 125/75 | HR 77 | Temp 98.7°F | Ht 66.0 in | Wt 141.0 lb

## 2023-03-30 DIAGNOSIS — R21 Rash and other nonspecific skin eruption: Secondary | ICD-10-CM | POA: Diagnosis not present

## 2023-03-30 NOTE — Progress Notes (Signed)
Acute Office Visit  Subjective:  Patient ID: Sylvia Bowen, female    DOB: 09-21-1946, 77 y.o.   MRN: [id number]  Chief Complaint  Patient presents with   Insect Bite    Tick bite x    HPI Patient is in today for follow up after a tick bite.  States that she got home from [place] and was in bed for 3 days with fatigue and body aches. She had fever and headaches. Tmax was 99.4. States her base temp is 97.1. Her last fever was 6/9. States that she found a tick on her left leg on 6/8 and it was engorged. She was prescribed doxycycline on 6/9 via telehealth. Reports that 6/10 had wheezing then developed "bumps" on her backside, legs, and eyelid. She then had chest pain which she read was a side effect of doxy. She took something for indigestion which helped the chest pain. She took advil, antihistamine, and gas-x. States that the wheezing then resolved last night after she took antihistamine. States that her last dose of doxycycline was last night at 5 oclock. She states that she is feeling much better since then and that the rash is getting better.   ROS As per HPI  Objective:  BP 125/75   Pulse 77   Temp 98.7 F (37.1 C)   Ht 5\' 6"  (1.676 m)   Wt 141 lb (64 kg)   SpO2 96%   BMI 22.76 kg/m   Physical Exam Constitutional:      General: She is awake. She is not in acute distress.    Appearance: Normal appearance. She is well-developed and well-groomed. She is not ill-appearing, toxic-appearing or diaphoretic.  Cardiovascular:     Rate and Rhythm: Normal rate.     Pulses: Normal pulses.          Radial pulses are 2+ on the right side and 2+ on the left side.       Posterior tibial pulses are 2+ on the right side and 2+ on the left side.     Heart sounds: Normal heart sounds. No murmur heard.    No gallop.  Pulmonary:     Effort: Pulmonary effort is normal. No respiratory distress.     Breath sounds: Normal breath sounds. No stridor. No wheezing, rhonchi or rales.   Musculoskeletal:     Cervical back: Full passive range of motion without pain and neck supple.     Right lower leg: No edema.     Left lower leg: No edema.  Skin:    General: Skin is warm.     Capillary Refill: Capillary refill takes less than 2 seconds.     Findings: Rash present. Rash is urticarial.     Comments: Diffuse flat, erythematous wheals along legs and trunk   Neurological:     General: No focal deficit present.     Mental Status: She is alert, oriented to person, place, and time and easily aroused. Mental status is at baseline.     GCS: GCS eye subscore is 4. GCS verbal subscore is 5. GCS motor subscore is 6.     Motor: No weakness.  Psychiatric:        Attention and Perception: Attention and perception normal.        Mood and Affect: Mood and affect normal.        Speech: Speech normal.        Behavior: Behavior normal. Behavior is cooperative.  Thought Content: Thought content normal. Thought content does not include homicidal or suicidal ideation. Thought content does not include homicidal or suicidal plan.        Cognition and Memory: Cognition and memory normal.        Judgment: Judgment normal.      Assessment & Plan:  1. Rash Discussed with patient that symptoms may be due to tick borne illness. Per CDC it is too early for serology testing. Patient is intolerant to doxycycline and does not wish to continue antibiotics at this time. Instructed patient to follow up if she is not improving. Discussed with patient the OTC antihistamines that she could take such as benadryl and pepcid. Discussed side effects with patient. Patient has labs ordered from her PCP and plans to have then drawn tomorrow while fasting.   The above assessment and management plan was discussed with the patient. The patient verbalized understanding of and has agreed to the management plan using shared-decision making. Patient is aware to call the clinic if they develop any new symptoms or if  symptoms fail to improve or worsen. Patient is aware when to return to the clinic for a follow-up visit. Patient educated on when it is appropriate to go to the emergency department.   Return if symptoms worsen or fail to improve.  Neale Burly, DNP-FNP [facility] [address] [place], [place] [address] [phone]

## 2023-04-02 DIAGNOSIS — M810 Age-related osteoporosis without current pathological fracture: Secondary | ICD-10-CM | POA: Diagnosis not present

## 2023-04-02 DIAGNOSIS — Z0000 Encounter for general adult medical examination without abnormal findings: Secondary | ICD-10-CM | POA: Diagnosis not present

## 2023-04-02 LAB — CBC WITH DIFFERENTIAL/PLATELET
Hematocrit: 43.4 % (ref 34.0–46.6)
Monocytes: 7 %
Neutrophils: 49 %
RBC: 4.84 x10E6/uL (ref 3.77–5.28)

## 2023-04-02 LAB — CMP14+EGFR
Albumin: 4.3 g/dL (ref 3.8–4.8)
Alkaline Phosphatase: 107 IU/L (ref 44–121)
BUN: 16 mg/dL (ref 8–27)
Calcium: 9.3 mg/dL (ref 8.7–10.3)
Chloride: 102 mmol/L (ref 96–106)
Globulin, Total: 2.8 g/dL (ref 1.5–4.5)
Glucose: 90 mg/dL (ref 70–99)
Sodium: 139 mmol/L (ref 134–144)
Total Protein: 7.1 g/dL (ref 6.0–8.5)

## 2023-04-02 LAB — LIPID PANEL: HDL: 62 mg/dL (ref >39)

## 2023-04-03 LAB — CMP14+EGFR
ALT: 59 IU/L — ABNORMAL HIGH (ref 0–32)
AST: 38 IU/L (ref 0–40)
Albumin/Globulin Ratio: 1.5
BUN/Creatinine Ratio: 20 (ref 12–28)
Bilirubin Total: 0.4 mg/dL (ref 0.0–1.2)
CO2: 24 mmol/L (ref 20–29)
Creatinine, Ser: 0.82 mg/dL (ref 0.57–1.00)
Potassium: 4.9 mmol/L (ref 3.5–5.2)
eGFR: 74 mL/min/{1.73_m2} (ref >59)

## 2023-04-03 LAB — LIPID PANEL
Chol/HDL Ratio: 4.1 ratio (ref 0.0–4.4)
Cholesterol, Total: 257 mg/dL — ABNORMAL HIGH (ref 100–199)
LDL Chol Calc (NIH): 179 mg/dL — ABNORMAL HIGH (ref 0–99)
Triglycerides: 93 mg/dL (ref 0–149)
VLDL Cholesterol Cal: 16 mg/dL (ref 5–40)

## 2023-04-03 LAB — CBC WITH DIFFERENTIAL/PLATELET
Basophils Absolute: 0.1 10*3/uL (ref 0.0–0.2)
Basos: 2 %
EOS (ABSOLUTE): 0.3 10*3/uL (ref 0.0–0.4)
Eos: 7 %
Hemoglobin: 14.4 g/dL (ref 11.1–15.9)
Immature Grans (Abs): 0 10*3/uL (ref 0.0–0.1)
Immature Granulocytes: 0 %
Lymphocytes Absolute: 1.3 10*3/uL (ref 0.7–3.1)
Lymphs: 35 %
MCH: 29.8 pg (ref 26.6–33.0)
MCHC: 33.2 g/dL (ref 31.5–35.7)
MCV: 90 fL (ref 79–97)
Monocytes Absolute: 0.3 10*3/uL (ref 0.1–0.9)
Neutrophils Absolute: 1.8 10*3/uL (ref 1.4–7.0)
Platelets: 288 10*3/uL (ref 150–450)
RDW: 12.3 % (ref 11.7–15.4)
WBC: 3.7 10*3/uL (ref 3.4–10.8)

## 2023-04-03 LAB — VITAMIN D 25 HYDROXY (VIT D DEFICIENCY, FRACTURES): Vit D, 25-Hydroxy: 30.1 ng/mL (ref 30.0–100.0)

## 2023-04-06 DIAGNOSIS — M9904 Segmental and somatic dysfunction of sacral region: Secondary | ICD-10-CM | POA: Diagnosis not present

## 2023-04-06 DIAGNOSIS — M9905 Segmental and somatic dysfunction of pelvic region: Secondary | ICD-10-CM | POA: Diagnosis not present

## 2023-04-06 DIAGNOSIS — M531 Cervicobrachial syndrome: Secondary | ICD-10-CM | POA: Diagnosis not present

## 2023-04-06 DIAGNOSIS — M9901 Segmental and somatic dysfunction of cervical region: Secondary | ICD-10-CM | POA: Diagnosis not present

## 2023-04-08 DIAGNOSIS — L821 Other seborrheic keratosis: Secondary | ICD-10-CM | POA: Diagnosis not present

## 2023-04-08 DIAGNOSIS — L814 Other melanin hyperpigmentation: Secondary | ICD-10-CM | POA: Diagnosis not present

## 2023-04-08 DIAGNOSIS — D225 Melanocytic nevi of trunk: Secondary | ICD-10-CM | POA: Diagnosis not present

## 2023-04-15 ENCOUNTER — Ambulatory Visit (INDEPENDENT_AMBULATORY_CARE_PROVIDER_SITE_OTHER): Payer: Medicare PPO

## 2023-04-15 VITALS — Ht 66.0 in | Wt 139.0 lb

## 2023-04-15 DIAGNOSIS — Z0000 Encounter for general adult medical examination without abnormal findings: Secondary | ICD-10-CM | POA: Diagnosis not present

## 2023-04-15 NOTE — Progress Notes (Signed)
Subjective:   Sylvia Bowen is a 77 y.o. female who presents for Medicare Annual (Subsequent) preventive examination.  Visit Complete: Virtual  I connected with  Sylvia Bowen on 04/15/23 by a audio enabled telemedicine application and verified that I am speaking with the correct person using two identifiers.  Patient Location: Home  Provider Location: Home Office  I discussed the limitations of evaluation and management by telemedicine. The patient expressed understanding and agreed to proceed.  Patient Medicare AWV questionnaire was completed by the patient on 04/15/2023; I have confirmed that all information answered by patient is correct and no changes since this date.  Review of Systems     Cardiac Risk Factors include: advanced age (>48men, >87 women)     Objective:    Today's Vitals   04/15/23 1122  Weight: 139 lb (63 kg)  Height: 5\' 6"  (1.676 m)   Body mass index is 22.44 kg/m.     04/15/2023   11:26 AM 12/12/2021    8:42 AM 11/13/2021    8:34 AM 10/14/2020    1:37 PM  Advanced Directives  Does Patient Have a Medical Advance Directive? Yes No No No  Type of Estate agent of [place];Living will     Copy of Healthcare Power of Attorney in Chart? No - copy requested     Would patient like information on creating a medical advance directive?  Yes (MAU/Ambulatory/Procedural Areas - Information given) No - Patient declined No - Patient declined    Current Medications (verified) Outpatient Encounter Medications as of 04/15/2023  Medication Sig   Multiple Vitamins-Minerals (MULTIVITAMIN ADULTS PO) Take by mouth.   valACYclovir (VALTREX) 500 MG tablet Take 1 tablet (500 mg total) by mouth 2 (two) times daily. For 3 days with flare.   VITAMIN D PO Take by mouth.   doxycycline (VIBRA-TABS) 100 MG tablet Take 1 tablet (100 mg total) by mouth 2 (two) times daily. (Patient not taking: Reported on 03/30/2023)   No facility-administered encounter  medications on file as of 04/15/2023.    Allergies (verified) Clindamycin, Erythromycin base, Levofloxacin, and Doxycycline   History: Past Medical History:  Diagnosis Date   Allergy    Anemia    Arthritis    Asthma    Blood transfusion without reported diagnosis 2001   Fever blister    Heart murmur    History of colon polyps    Osteoporosis    Past Surgical History:  Procedure Laterality Date   ABDOMINAL HYSTERECTOMY  2012   DILATION AND CURETTAGE OF UTERUS  1983   KNEE ARTHROSCOPY  2015   Family History  Problem Relation Age of Onset   Arthritis Mother    Hearing loss Mother    Miscarriages / [place] Mother    Stroke Mother    Cancer Father    Heart attack Father    Stroke Father    Cancer Brother 26       colon   Arthritis Brother    Stroke Maternal Grandmother    Stroke Maternal Grandfather    Stroke Paternal Grandmother    Stroke Paternal Grandfather    Stroke Son    Social History   Socioeconomic History   Marital status: Married    Spouse name: Not on file   Number of children: Not on file   Years of education: Not on file   Highest education level: Not on file  Occupational History   Not on file  Tobacco Use  Smoking status: Former    Packs/day: 0.50    Years: 2.00    Additional pack years: 0.00    Total pack years: 1.00    Types: Cigarettes    Quit date: 1970    Years since quitting: 54.5   Smokeless tobacco: Never  Vaping Use   Vaping Use: Never used  Substance and Sexual Activity   Alcohol use: Not Currently   Drug use: Never   Sexual activity: Not Currently    Birth control/protection: Surgical  Other Topics Concern   Not on file  Social History Narrative   Not on file   Social Determinants of Health   Financial Resource Strain: Low Risk  (04/15/2023)   Overall Financial Resource Strain (CARDIA)    Difficulty of Paying Living Expenses: Not hard at all  Food Insecurity: No Food Insecurity (04/15/2023)   Hunger Vital Sign     Worried About Running Out of Food in the Last Year: Never true    Ran Out of Food in the Last Year: Never true  Transportation Needs: No Transportation Needs (04/15/2023)   PRAPARE - Administrator, Civil Service (Medical): No    Lack of Transportation (Non-Medical): No  Physical Activity: Insufficiently Active (04/15/2023)   Exercise Vital Sign    Days of Exercise per Week: 3 days    Minutes of Exercise per Session: 30 min  Stress: No Stress Concern Present (04/15/2023)   [employer] of Occupational Health - Occupational Stress Questionnaire    Feeling of Stress : Not at all  Social Connections: Socially Integrated (04/15/2023)   Social Connection and Isolation Panel [NHANES]    Frequency of Communication with Friends and Family: More than three times a week    Frequency of Social Gatherings with Friends and Family: More than three times a week    Attends Religious Services: More than 4 times per year    Active Member of [employer] or Organizations: Yes    Attends Engineer, structural: More than 4 times per year    Marital Status: Married    Tobacco Counseling Counseling given: Not Answered   Clinical Intake:  Pre-visit preparation completed: Yes  Pain : No/denies pain     Nutritional Risks: None Diabetes: No  How often do you need to have someone help you when you read instructions, pamphlets, or other written materials from your doctor or pharmacy?: 1 - Never  Interpreter Needed?: No  Information entered by :: Renie Ora, LPN   Activities of Daily Living    04/15/2023   11:26 AM  In your present state of health, do you have any difficulty performing the following activities:  Hearing? 0  Vision? 0  Difficulty concentrating or making decisions? 0  Walking or climbing stairs? 0  Dressing or bathing? 0  Doing errands, shopping? 0  Preparing Food and eating ? N  Using the Toilet? N  In the past six months, have you accidently leaked urine? N   Do you have problems with loss of bowel control? N  Managing your Medications? N  Managing your Finances? N  Housekeeping or managing your Housekeeping? N    Patient Care Team: Dettinger, Elige Radon, MD as PCP - General (Family Medicine) Leverne Humbles, MD as Referring Physician (Cardiology) Janalyn Harder, MD (Inactive) as Consulting Physician (Dermatology)  Indicate any recent Medical Services you may have received from other than [facility] providers in the past year (date may be approximate).     Assessment:  This is a routine wellness examination for Sylvia Bowen.  Hearing/Vision screen Vision Screening - Comments:: Wears rx glasses - up to date with routine eye exams with  Dr.Burk  Dietary issues and exercise activities discussed:     Goals Addressed             This Visit's Progress    Patient Stated   On track    Increase activity, drink more water & eat healthier       Depression Screen    04/15/2023   11:25 AM 03/25/2023    1:25 PM 03/25/2023    1:21 PM 12/12/2021    8:09 AM 11/13/2021    8:40 AM 11/13/2021    8:25 AM 08/14/2021    9:46 AM  PHQ 2/9 Scores  PHQ - 2 Score 0 0 0 0 0 0 0  PHQ- 9 Score 0 2  2   1     Fall Risk    04/15/2023   11:23 AM 12/12/2021    8:08 AM 11/13/2021    8:36 AM 08/14/2021    9:36 AM 10/14/2020    1:38 PM  Fall Risk   Falls in the past year? 0 1 0 1 1  Number falls in past yr: 0 0 0 0 0  Injury with Fall? 0 1 0 1 1  Comment    January 2022, riub dislocation   Risk for fall due to : No Fall Risks No Fall Risks No Fall Risks No Fall Risks History of fall(s)  Follow up Falls prevention discussed Falls evaluation completed Falls evaluation completed Falls evaluation completed Falls prevention discussed    MEDICARE RISK AT HOME:  Medicare Risk at Home - 04/15/23 1123     Any stairs in or around the home? No    If so, are there any without handrails? No    Home free of loose throw rugs in walkways, pet beds, electrical cords, etc? Yes     Adequate lighting in your home to reduce risk of falls? Yes    Life alert? No    Use of a cane, walker or w/c? No    Grab bars in the bathroom? Yes    Shower chair or bench in shower? Yes    Elevated toilet seat or a handicapped toilet? Yes             TIMED UP AND GO:  Was the test performed?  No    Cognitive Function:        04/15/2023   11:26 AM 12/12/2021    8:09 AM  6CIT Screen  What Year? 0 points 0 points  What month? 0 points 0 points  What time? 0 points 0 points  Count back from 20 0 points 0 points  Months in reverse 0 points 0 points  Repeat phrase 0 points 0 points  Total Score 0 points 0 points    Immunizations Immunization History  Administered Date(s) Administered   Fluad Quad(high Dose 65+) 08/14/2021   Influenza, High Dose Seasonal PF 06/17/2017, 07/21/2019   Janssen (J&J) SARS-COV-2 Vaccination 01/29/2020   Pneumococcal Conjugate-13 09/24/2016   Pneumococcal Polysaccharide-23 10/21/2017   Tdap 10/19/2013   Zoster Recombinat (Shingrix) 08/11/2019, 10/31/2019    TDAP status: Up to date  Flu Vaccine status: Up to date  Pneumococcal vaccine status: Up to date  Covid-19 vaccine status: Completed vaccines  Qualifies for Shingles Vaccine? Yes   Zostavax completed Yes   Shingrix Completed?: Yes  Screening Tests Health Maintenance  Topic Date Due   Hepatitis C Screening  Never done   COVID-19 Vaccine (2 - Janssen risk series) 02/26/2020   INFLUENZA VACCINE  05/20/2023   MAMMOGRAM  07/29/2023   DTaP/Tdap/Td (2 - Td or Tdap) 10/20/2023   Medicare Annual Wellness (AWV)  04/14/2024   Pneumonia Vaccine 53+ Years old  Completed   DEXA SCAN  Completed   Zoster Vaccines- Shingrix  Completed   HPV VACCINES  Aged Out   Colonoscopy  Discontinued    Health Maintenance  Health Maintenance Due  Topic Date Due   Hepatitis C Screening  Never done   COVID-19 Vaccine (2 - Janssen risk series) 02/26/2020    Colorectal cancer screening: No  longer required.   Mammogram status: No longer required due to age.  Bone Density status: Completed 01/23/2022. Results reflect: Bone density results: OSTEOPENIA. Repeat every 5 years.  Lung Cancer Screening: (Low Dose CT Chest recommended if Age 17-80 years, 20 pack-year currently smoking OR have quit w/in 15years.) does not qualify.   Lung Cancer Screening Referral: n/a  Additional Screening:  Hepatitis C Screening: does qualify;   Vision Screening: Recommended annual ophthalmology exams for early detection of glaucoma and other disorders of the eye. Is the patient up to date with their annual eye exam?  Yes  Who is the provider or what is the name of the office in which the patient attends annual eye exams? Dr.Burk If pt is not established with a provider, would they like to be referred to a provider to establish care? No .   Dental Screening: Recommended annual dental exams for proper oral hygiene  Diabetic Foot Exam: Diabetic Foot Exam: Overdue, Pt has been advised about the importance in completing this exam. Pt is scheduled for diabetic foot exam on next office visit .  Community Resource Referral / Chronic Care Management: CRR required this visit?  No   CCM required this visit?  No     Plan:     I have personally reviewed and noted the following in the patient's chart:   Medical and social history Use of alcohol, tobacco or illicit drugs  Current medications and supplements including opioid prescriptions. Patient is not currently taking opioid prescriptions. Functional ability and status Nutritional status Physical activity Advanced directives List of other physicians Hospitalizations, surgeries, and ER visits in previous 12 months Vitals Screenings to include cognitive, depression, and falls Referrals and appointments  In addition, I have reviewed and discussed with patient certain preventive protocols, quality metrics, and best practice recommendations. A  written personalized care plan for preventive services as well as general preventive health recommendations were provided to patient.     Lorrene Reid, LPN   05/29/9146   After Visit Summary: (MyChart) Due to this being a telephonic visit, the after visit summary with patients personalized plan was offered to patient via MyChart   Nurse Notes: none

## 2023-04-15 NOTE — Patient Instructions (Signed)
Sylvia Bowen , Thank you for taking time to come for your Medicare Wellness Visit. I appreciate your ongoing commitment to your health goals. Please review the following plan we discussed and let me know if I can assist you in the future.   These are the goals we discussed:  Goals      Patient Stated     Increase activity, drink more water & eat healthier        This is a list of the screening recommended for you and due dates:  Health Maintenance  Topic Date Due   Hepatitis C Screening  Never done   COVID-19 Vaccine (2 - Janssen risk series) 02/26/2020   Flu Shot  05/20/2023   Mammogram  07/29/2023   DTaP/Tdap/Td vaccine (2 - Td or Tdap) 10/20/2023   Medicare Annual Wellness Visit  04/14/2024   Pneumonia Vaccine  Completed   DEXA scan (bone density measurement)  Completed   Zoster (Shingles) Vaccine  Completed   HPV Vaccine  Aged Out   Colon Cancer Screening  Discontinued    Advanced directives: Please bring a copy of your health care power of attorney and living will to the office to be added to your chart at your convenience.   Conditions/risks identified: Aim for 30 minutes of exercise or brisk walking, 6-8 glasses of water, and 5 servings of fruits and vegetables each day.   Next appointment: Follow up in one year for your annual wellness visit    Preventive Care 65 Years and Older, Female Preventive care refers to lifestyle choices and visits with your health care provider that can promote health and wellness. What does preventive care include? A yearly physical exam. This is also called an annual well check. Dental exams once or twice a year. Routine eye exams. Ask your health care provider how often you should have your eyes checked. Personal lifestyle choices, including: Daily care of your teeth and gums. Regular physical activity. Eating a healthy diet. Avoiding tobacco and drug use. Limiting alcohol use. Practicing safe sex. Taking low-dose aspirin every  day. Taking vitamin and mineral supplements as recommended by your health care provider. What happens during an annual well check? The services and screenings done by your health care provider during your annual well check will depend on your age, overall health, lifestyle risk factors, and family history of disease. Counseling  Your health care provider may ask you questions about your: Alcohol use. Tobacco use. Drug use. Emotional well-being. Home and relationship well-being. Sexual activity. Eating habits. History of falls. Memory and ability to understand (cognition). Work and work Astronomer. Reproductive health. Screening  You may have the following tests or measurements: Height, weight, and BMI. Blood pressure. Lipid and cholesterol levels. These may be checked every 5 years, or more frequently if you are over 60 years old. Skin check. Lung cancer screening. You may have this screening every year starting at age 34 if you have a 30-pack-year history of smoking and currently smoke or have quit within the past 15 years. Fecal occult blood test (FOBT) of the stool. You may have this test every year starting at age 76. Flexible sigmoidoscopy or colonoscopy. You may have a sigmoidoscopy every 5 years or a colonoscopy every 10 years starting at age 76. Hepatitis C blood test. Hepatitis B blood test. Sexually transmitted disease (STD) testing. Diabetes screening. This is done by checking your blood sugar (glucose) after you have not eaten for a while (fasting). You may have this done  every 1-3 years. Bone density scan. This is done to screen for osteoporosis. You may have this done starting at age 76. Mammogram. This may be done every 1-2 years. Talk to your health care provider about how often you should have regular mammograms. Talk with your health care provider about your test results, treatment options, and if necessary, the need for more tests. Vaccines  Your health care  provider may recommend certain vaccines, such as: Influenza vaccine. This is recommended every year. Tetanus, diphtheria, and acellular pertussis (Tdap, Td) vaccine. You may need a Td booster every 10 years. Zoster vaccine. You may need this after age 76 Pneumococcal 13-valent conjugate (PCV13) vaccine. One dose is recommended after age 76 Pneumococcal polysaccharide (PPSV23) vaccine. One dose is recommended after age 76 Talk to your health care provider about which screenings and vaccines you need and how often you need them. This information is not intended to replace advice given to you by your health care provider. Make sure you discuss any questions you have with your health care provider. Document Released: 11/01/2015 Document Revised: 06/24/2016 Document Reviewed: 08/06/2015 Elsevier Interactive Patient Education  2017 [place] Prevention in the Home Falls can cause injuries. They can happen to people of all ages. There are many things you can do to make your home safe and to help prevent falls. What can I do on the outside of my home? Regularly fix the edges of walkways and driveways and fix any cracks. Remove anything that might make you trip as you walk through a door, such as a raised step or threshold. Trim any bushes or trees on the path to your home. Use bright outdoor lighting. Clear any walking paths of anything that might make someone trip, such as rocks or tools. Regularly check to see if handrails are loose or broken. Make sure that both sides of any steps have handrails. Any raised decks and porches should have guardrails on the edges. Have any leaves, snow, or ice cleared regularly. Use sand or salt on walking paths during winter. Clean up any spills in your garage right away. This includes oil or grease spills. What can I do in the bathroom? Use night lights. Install grab bars by the toilet and in the tub and shower. Do not use towel bars as grab  bars. Use non-skid mats or decals in the tub or shower. If you need to sit down in the shower, use a plastic, non-slip stool. Keep the floor dry. Clean up any water that spills on the floor as soon as it happens. Remove soap buildup in the tub or shower regularly. Attach bath mats securely with double-sided non-slip rug tape. Do not have throw rugs and other things on the floor that can make you trip. What can I do in the bedroom? Use night lights. Make sure that you have a light by your bed that is easy to reach. Do not use any sheets or blankets that are too big for your bed. They should not hang down onto the floor. Have a firm chair that has side arms. You can use this for support while you get dressed. Do not have throw rugs and other things on the floor that can make you trip. What can I do in the kitchen? Clean up any spills right away. Avoid walking on wet floors. Keep items that you use a lot in easy-to-reach places. If you need to reach something above you, use a strong step stool that has  a grab bar. Keep electrical cords out of the way. Do not use floor polish or wax that makes floors slippery. If you must use wax, use non-skid floor wax. Do not have throw rugs and other things on the floor that can make you trip. What can I do with my stairs? Do not leave any items on the stairs. Make sure that there are handrails on both sides of the stairs and use them. Fix handrails that are broken or loose. Make sure that handrails are as long as the stairways. Check any carpeting to make sure that it is firmly attached to the stairs. Fix any carpet that is loose or worn. Avoid having throw rugs at the top or bottom of the stairs. If you do have throw rugs, attach them to the floor with carpet tape. Make sure that you have a light switch at the top of the stairs and the bottom of the stairs. If you do not have them, ask someone to add them for you. What else can I do to help prevent  falls? Wear shoes that: Do not have high heels. Have rubber bottoms. Are comfortable and fit you well. Are closed at the toe. Do not wear sandals. If you use a stepladder: Make sure that it is fully opened. Do not climb a closed stepladder. Make sure that both sides of the stepladder are locked into place. Ask someone to hold it for you, if possible. Clearly mark and make sure that you can see: Any grab bars or handrails. First and last steps. Where the edge of each step is. Use tools that help you move around (mobility aids) if they are needed. These include: Canes. Walkers. Scooters. Crutches. Turn on the lights when you go into a dark area. Replace any light bulbs as soon as they burn out. Set up your furniture so you have a clear path. Avoid moving your furniture around. If any of your floors are uneven, fix them. If there are any pets around you, be aware of where they are. Review your medicines with your doctor. Some medicines can make you feel dizzy. This can increase your chance of falling. Ask your doctor what other things that you can do to help prevent falls. This information is not intended to replace advice given to you by your health care provider. Make sure you discuss any questions you have with your health care provider. Document Released: 08/01/2009 Document Revised: 03/12/2016 Document Reviewed: 11/09/2014 Elsevier Interactive Patient Education  2017 [employer].

## 2023-04-20 ENCOUNTER — Other Ambulatory Visit: Payer: Self-pay

## 2023-04-20 DIAGNOSIS — M9905 Segmental and somatic dysfunction of pelvic region: Secondary | ICD-10-CM | POA: Diagnosis not present

## 2023-04-20 DIAGNOSIS — M9901 Segmental and somatic dysfunction of cervical region: Secondary | ICD-10-CM | POA: Diagnosis not present

## 2023-04-20 DIAGNOSIS — M9904 Segmental and somatic dysfunction of sacral region: Secondary | ICD-10-CM | POA: Diagnosis not present

## 2023-04-20 DIAGNOSIS — M531 Cervicobrachial syndrome: Secondary | ICD-10-CM | POA: Diagnosis not present

## 2023-04-20 DIAGNOSIS — R7989 Other specified abnormal findings of blood chemistry: Secondary | ICD-10-CM

## 2023-05-10 ENCOUNTER — Other Ambulatory Visit: Payer: Medicare PPO

## 2023-05-10 DIAGNOSIS — R7989 Other specified abnormal findings of blood chemistry: Secondary | ICD-10-CM | POA: Diagnosis not present

## 2023-05-10 LAB — HEPATIC FUNCTION PANEL
AST: 18 IU/L (ref 0–40)
Albumin: 4.5 g/dL (ref 3.8–4.8)
Alkaline Phosphatase: 69 IU/L (ref 44–121)
Total Protein: 6.7 g/dL (ref 6.0–8.5)

## 2023-05-11 DIAGNOSIS — M9904 Segmental and somatic dysfunction of sacral region: Secondary | ICD-10-CM | POA: Diagnosis not present

## 2023-05-11 DIAGNOSIS — M531 Cervicobrachial syndrome: Secondary | ICD-10-CM | POA: Diagnosis not present

## 2023-05-11 DIAGNOSIS — M9905 Segmental and somatic dysfunction of pelvic region: Secondary | ICD-10-CM | POA: Diagnosis not present

## 2023-05-11 DIAGNOSIS — M9901 Segmental and somatic dysfunction of cervical region: Secondary | ICD-10-CM | POA: Diagnosis not present

## 2023-05-11 LAB — HEPATIC FUNCTION PANEL
ALT: 13 IU/L (ref 0–32)
Bilirubin Total: 0.5 mg/dL (ref 0.0–1.2)
Bilirubin, Direct: 0.13 mg/dL (ref 0.00–0.40)

## 2023-06-09 DIAGNOSIS — M531 Cervicobrachial syndrome: Secondary | ICD-10-CM | POA: Diagnosis not present

## 2023-06-09 DIAGNOSIS — M9904 Segmental and somatic dysfunction of sacral region: Secondary | ICD-10-CM | POA: Diagnosis not present

## 2023-06-09 DIAGNOSIS — M9905 Segmental and somatic dysfunction of pelvic region: Secondary | ICD-10-CM | POA: Diagnosis not present

## 2023-06-09 DIAGNOSIS — M9901 Segmental and somatic dysfunction of cervical region: Secondary | ICD-10-CM | POA: Diagnosis not present

## 2023-06-22 DIAGNOSIS — M9904 Segmental and somatic dysfunction of sacral region: Secondary | ICD-10-CM | POA: Diagnosis not present

## 2023-06-22 DIAGNOSIS — M9905 Segmental and somatic dysfunction of pelvic region: Secondary | ICD-10-CM | POA: Diagnosis not present

## 2023-06-22 DIAGNOSIS — M9901 Segmental and somatic dysfunction of cervical region: Secondary | ICD-10-CM | POA: Diagnosis not present

## 2023-06-22 DIAGNOSIS — M531 Cervicobrachial syndrome: Secondary | ICD-10-CM | POA: Diagnosis not present

## 2023-07-05 DIAGNOSIS — M531 Cervicobrachial syndrome: Secondary | ICD-10-CM | POA: Diagnosis not present

## 2023-07-05 DIAGNOSIS — M9905 Segmental and somatic dysfunction of pelvic region: Secondary | ICD-10-CM | POA: Diagnosis not present

## 2023-07-05 DIAGNOSIS — M9904 Segmental and somatic dysfunction of sacral region: Secondary | ICD-10-CM | POA: Diagnosis not present

## 2023-07-05 DIAGNOSIS — M9901 Segmental and somatic dysfunction of cervical region: Secondary | ICD-10-CM | POA: Diagnosis not present

## 2023-07-19 ENCOUNTER — Ambulatory Visit: Payer: Medicare PPO | Admitting: Family Medicine

## 2023-07-19 ENCOUNTER — Encounter: Payer: Self-pay | Admitting: Family Medicine

## 2023-07-19 VITALS — BP 115/61 | HR 67 | Temp 97.8°F | Ht 66.0 in | Wt 139.1 lb

## 2023-07-19 DIAGNOSIS — J302 Other seasonal allergic rhinitis: Secondary | ICD-10-CM | POA: Diagnosis not present

## 2023-07-19 DIAGNOSIS — M9904 Segmental and somatic dysfunction of sacral region: Secondary | ICD-10-CM | POA: Diagnosis not present

## 2023-07-19 DIAGNOSIS — N3000 Acute cystitis without hematuria: Secondary | ICD-10-CM

## 2023-07-19 DIAGNOSIS — R300 Dysuria: Secondary | ICD-10-CM | POA: Diagnosis not present

## 2023-07-19 DIAGNOSIS — M9901 Segmental and somatic dysfunction of cervical region: Secondary | ICD-10-CM | POA: Diagnosis not present

## 2023-07-19 DIAGNOSIS — R32 Unspecified urinary incontinence: Secondary | ICD-10-CM | POA: Diagnosis not present

## 2023-07-19 DIAGNOSIS — M531 Cervicobrachial syndrome: Secondary | ICD-10-CM | POA: Diagnosis not present

## 2023-07-19 DIAGNOSIS — M9905 Segmental and somatic dysfunction of pelvic region: Secondary | ICD-10-CM | POA: Diagnosis not present

## 2023-07-19 LAB — URINALYSIS, ROUTINE W REFLEX MICROSCOPIC
Bilirubin, UA: NEGATIVE
Glucose, UA: NEGATIVE
Ketones, UA: NEGATIVE
Nitrite, UA: POSITIVE — AB
Protein,UA: NEGATIVE
Specific Gravity, UA: 1.025 (ref 1.005–1.030)
Urobilinogen, Ur: 0.2 mg/dL (ref 0.2–1.0)
pH, UA: 6.5 (ref 5.0–7.5)

## 2023-07-19 LAB — MICROSCOPIC EXAMINATION
RBC, Urine: NONE SEEN /[HPF] (ref 0–2)
Renal Epithel, UA: NONE SEEN /[HPF]
Yeast, UA: NONE SEEN

## 2023-07-19 MED ORDER — CEPHALEXIN 500 MG PO CAPS: 500.0000 mg | ORAL_CAPSULE | Freq: Two times a day (BID) | ORAL | 0 refills | Status: DC

## 2023-07-19 MED ORDER — LEVOCETIRIZINE DIHYDROCHLORIDE 5 MG PO TABS: 5.0000 mg | ORAL_TABLET | Freq: Every evening | ORAL | 1 refills | Status: DC

## 2023-07-19 NOTE — Progress Notes (Signed)
Acute Office Visit  Subjective:     Patient ID: Sylvia Bowen, female    DOB: 1946/04/08, 77 y.o.   MRN: [id number]  Chief Complaint  Patient presents with   Dysuria    Dysuria  This is a new problem. Episode onset: 6 weeks. The problem occurs intermittently. The problem has been waxing and waning. The quality of the pain is described as burning. Associated symptoms include frequency and urgency. Pertinent negatives include no chills, discharge, flank pain, nausea, possible pregnancy or vomiting. Treatments tried: AZO. Improvement on treatment: relief while taking, symtpoms return when stopping AZO.   She is interested in a referral to GYN. Has chronic urinary frequency, dysuria, incontinence and has a hx of hysterectomy.   She also reports nasal sneezing, watery eyes, runny nose. She has been taking zyrtec daily without relief. Hx of seasonal allergies in the fall.   Review of Systems  Constitutional:  Negative for chills.  Gastrointestinal:  Negative for nausea and vomiting.  Genitourinary:  Positive for dysuria, frequency and urgency. Negative for flank pain.        Objective:    BP 115/61   Pulse 67   Temp 97.8 F (36.6 C) (Temporal)   Ht 5\' 6"  (1.676 m)   Wt 139 lb 2 oz (63.1 kg)   SpO2 97%   BMI 22.46 kg/m    Physical Exam Vitals and nursing note reviewed.  Constitutional:      General: She is not in acute distress.    Appearance: Normal appearance. She is not ill-appearing, toxic-appearing or diaphoretic.  HENT:     Nose: Rhinorrhea present.  Eyes:     General:        Right eye: No discharge.        Left eye: No discharge.  Cardiovascular:     Pulses: Normal pulses.     Heart sounds: Normal heart sounds. No murmur heard. Pulmonary:     Effort: Pulmonary effort is normal. No respiratory distress.  Abdominal:     General: Bowel sounds are normal. There is no distension.     Palpations: Abdomen is soft.     Tenderness: There is no abdominal  tenderness. There is no right CVA tenderness, left CVA tenderness, guarding or rebound.  Musculoskeletal:     Cervical back: Neck supple. No rigidity.  Skin:    General: Skin is warm and dry.  Neurological:     Mental Status: She is alert.     Urine dipstick shows positive for nitrates and positive for leukocytes.  Micro exam: 0-5 WBC's per HPF, 0 RBC's per HPF, and few + bacteria.       Assessment & Plan:   Jazzy was seen today for dysuria.  Diagnoses and all orders for this visit:  Acute cystitis without hematuria Keflex as below pending culture.  -     Urinalysis, Routine w reflex microscopic -     Urine Culture -     cephALEXin (KEFLEX) 500 MG capsule; Take 1 capsule (500 mg total) by mouth 2 (two) times daily.  Incontinence in female Referral placed as requested.  -     Ambulatory referral to Gynecology  Seasonal allergies Will try switch to xyzal.  -     levocetirizine (XYZAL) 5 MG tablet; Take 1 tablet (5 mg total) by mouth every evening.  Return to office for new or worsening symptoms, or if symptoms persist.   The patient indicates understanding of these issues and agrees with the  plan.  Gabriel Earing, FNP

## 2023-07-21 LAB — URINE CULTURE

## 2023-08-03 DIAGNOSIS — H1031 Unspecified acute conjunctivitis, right eye: Secondary | ICD-10-CM | POA: Diagnosis not present

## 2023-08-11 DIAGNOSIS — M531 Cervicobrachial syndrome: Secondary | ICD-10-CM | POA: Diagnosis not present

## 2023-08-11 DIAGNOSIS — M9904 Segmental and somatic dysfunction of sacral region: Secondary | ICD-10-CM | POA: Diagnosis not present

## 2023-08-11 DIAGNOSIS — M9901 Segmental and somatic dysfunction of cervical region: Secondary | ICD-10-CM | POA: Diagnosis not present

## 2023-08-11 DIAGNOSIS — M9905 Segmental and somatic dysfunction of pelvic region: Secondary | ICD-10-CM | POA: Diagnosis not present

## 2023-08-12 NOTE — Progress Notes (Signed)
GYNECOLOGY  VISIT   HPI: 77 y.o.   Married  Caucasian  female   [id number] with No LMP recorded. Patient has had a hysterectomy.   here for   ACUTE NEW GYN-incontinence. Pt needs a refill for Vitamin D. Also wants to discuss hemorrhoids and UTI being linked.    Feeling pelvic heaviness.  Can need to spint around the rectum to have a bowel movement.  Rare accidental leakage of stool if has diarrhea.  Status TVH and rectocele repair - 12 - 13 years ago in [place]. Ovaries remain.    Concerned she is not voiding completely.  Rare leakage of urine with exercise.  Some urgency related leakage.  DF 1.5 - 2 hours.  NF at least a couple of times.  No enuresis.  Hx pyelonephritis in pregnancy and then passed a possible kidney stone 15 years ago.  For the past 6 months has had burning with urination.  She self treats with AZO.  Wears a light pad due to urinary incontinence.   Patient did tx with Kelex recently and had had some relief of burning even through her UC was neg.  Has not used vag Rx prescription.   Has hemorrhoid.  Sees GI in [place] who has recommended banding. Using fiber twice a day.   Not sexually active.  It is uncomfortble. Husband with health issues.   Asking for prescription for vitamin D.   GYNECOLOGIC HISTORY: No LMP recorded. Patient has had a hysterectomy. Contraception:  hyst Menopausal hormone therapy:  n/a Last mammogram:  07/28/22 Breast Density Cat B, BI-RADS CAT 1 neg Last pap smear:   2012 per pt        OB History     Gravida  7   Para  6   Term  6   Preterm      AB      Living         SAB      IAB      Ectopic      Multiple      Live Births                 Patient Active Problem List   Diagnosis Date Noted   Seasonal allergies 09/03/2020   Fever blister    History of colon polyps    Heart murmur    Leukopenia 08/14/2019   Recurrent herpes labialis 10/21/2017   Osteoporosis 11/09/2016   Cortical  age-related cataract of both eyes 10/29/2016    Past Medical History:  Diagnosis Date   Allergy    Anemia    Arthritis    Asthma    Blood transfusion without reported diagnosis 2001   Fever blister    Heart murmur    History of colon polyps    Osteoporosis     Past Surgical History:  Procedure Laterality Date   ABDOMINAL HYSTERECTOMY  2012   DILATION AND CURETTAGE OF UTERUS  1983   KNEE ARTHROSCOPY  2015    Current Outpatient Medications  Medication Sig Dispense Refill   cephALEXin (KEFLEX) 500 MG capsule Take 1 capsule (500 mg total) by mouth 2 (two) times daily. 14 capsule 0   levocetirizine (XYZAL) 5 MG tablet Take 1 tablet (5 mg total) by mouth every evening. 90 tablet 1   Multiple Vitamins-Minerals (MULTIVITAMIN ADULTS PO) Take by mouth.     valACYclovir (VALTREX) 500 MG tablet Take 1 tablet (500 mg total) by mouth 2 (two) times daily. For 3  days with flare. 30 tablet 1   VITAMIN D PO Take by mouth.     No current facility-administered medications for this visit.     ALLERGIES: Clindamycin, Erythromycin base, Levofloxacin, and Doxycycline  Family History  Problem Relation Age of Onset   Arthritis Mother    Hearing loss Mother    Miscarriages / [place] Mother    Stroke Mother    Cancer Father    Heart attack Father    Stroke Father    Cancer Brother 26       colon   Arthritis Brother    Stroke Maternal Grandmother    Stroke Maternal Grandfather    Stroke Paternal Grandmother    Stroke Paternal Grandfather    Stroke Son     Social History   Socioeconomic History   Marital status: Married    Spouse name: Not on file   Number of children: Not on file   Years of education: Not on file   Highest education level: Not on file  Occupational History   Not on file  Tobacco Use   Smoking status: Former    Current packs/day: 0.00    Average packs/day: 0.5 packs/day for 2.0 years (1.0 ttl pk-yrs)    Types: Cigarettes    Start date: 1968    Quit date:  1970    Years since quitting: 54.8   Smokeless tobacco: Never  Vaping Use   Vaping status: Never Used  Substance and Sexual Activity   Alcohol use: Not Currently   Drug use: Never   Sexual activity: Not Currently    Birth control/protection: Surgical  Other Topics Concern   Not on file  Social History Narrative   Not on file   Social Determinants of Health   Financial Resource Strain: Low Risk  (04/15/2023)   Overall Financial Resource Strain (CARDIA)    Difficulty of Paying Living Expenses: Not hard at all  Food Insecurity: No Food Insecurity (04/15/2023)   Hunger Vital Sign    Worried About Running Out of Food in the Last Year: Never true    Ran Out of Food in the Last Year: Never true  Transportation Needs: No Transportation Needs (04/15/2023)   PRAPARE - Administrator, Civil Service (Medical): No    Lack of Transportation (Non-Medical): No  Physical Activity: Insufficiently Active (04/15/2023)   Exercise Vital Sign    Days of Exercise per Week: 3 days    Minutes of Exercise per Session: 30 min  Stress: No Stress Concern Present (04/15/2023)   [employer] of Occupational Health - Occupational Stress Questionnaire    Feeling of Stress : Not at all  Social Connections: Socially Integrated (04/15/2023)   Social Connection and Isolation Panel [NHANES]    Frequency of Communication with Friends and Family: More than three times a week    Frequency of Social Gatherings with Friends and Family: More than three times a week    Attends Religious Services: More than 4 times per year    Active Member of [employer] or Organizations: Yes    Attends Banker Meetings: More than 4 times per year    Marital Status: Married  Catering manager Violence: Not At Risk (04/15/2023)   Humiliation, Afraid, Rape, and Kick questionnaire    Fear of Current or Ex-Partner: No    Emotionally Abused: No    Physically Abused: No    Sexually Abused: No    Review of Systems  All  other systems  reviewed and are negative.   PHYSICAL EXAMINATION:    BP 118/66 (BP Location: Right Arm, Patient Position: Sitting, Cuff Size: Normal)   Pulse 69   Ht 5\' 5"  (1.651 m)   Wt 138 lb (62.6 kg)   SpO2 97%   BMI 22.96 kg/m     General appearance: alert, cooperative and appears stated age Head: Normocephalic, without obvious abnormality, atraumatic Neck: no adenopathy, supple, symmetrical, trachea midline and thyroid normal to inspection and palpation Lungs: clear to auscultation bilaterally Breasts: normal appearance, no masses or tenderness, No nipple retraction or dimpling, No nipple discharge or bleeding, No axillary or supraclavicular adenopathy Heart: regular rate and rhythm Abdomen: soft, non-tender, no masses,  no organomegaly Extremities: extremities normal, atraumatic, no cyanosis or edema Skin: Skin color, texture, turgor normal. No rashes or lesions Lymph nodes: Cervical, supraclavicular, and axillary nodes normal. No abnormal inguinal nodes palpated Neurologic: Grossly normal  Pelvic: External genitalia:  no lesions              Urethra:  normal appearing urethra with no masses, tenderness or lesions              Bartholins and Skenes: normal                 Vagina: normal appearing vagina with normal color and discharge, no lesions.  First degree cystocele and second degree rectocele.  Atrophy noted.               Cervix: absent                Bimanual Exam:  Uterus:   absent.  Left vaginal apical scar and tethering detected.                Adnexa: no mass, fullness, tenderness              Rectal exam: Yes.  .  Confirms.              Anus:  normal sphincter tone, no lesions  Chaperone was present for exam:  Warren Lacy, CMA  ASSESSMENT  Status post TAH.  Ovaries remain.  Pelvic organ prolapse:  first degree cystocele and second degree rectocele.  Vaginal scar. Atrophy.  On Keflex.  Mixed urinary incontinence.  Mild.  PLAN  Urinalysis and reflex  culture.  We discussed prolapse and urinary incontinence.  We reviewed pessary care, pelvic floor therapy, and surgical care.  Will start with estradiol cream.  Instructed in use.  We discussed potential effect on breast cancer.  I recommend she update her mammogram. Fu in 3 months for a recheck.    33 min  total time was spent for this patient encounter, including preparation, face-to-face counseling with the patient, coordination of care, and documentation of the encounter.

## 2023-08-19 DIAGNOSIS — M79641 Pain in right hand: Secondary | ICD-10-CM | POA: Diagnosis not present

## 2023-08-19 DIAGNOSIS — M65311 Trigger thumb, right thumb: Secondary | ICD-10-CM | POA: Diagnosis not present

## 2023-08-19 DIAGNOSIS — M25521 Pain in right elbow: Secondary | ICD-10-CM | POA: Diagnosis not present

## 2023-08-19 DIAGNOSIS — M13841 Other specified arthritis, right hand: Secondary | ICD-10-CM | POA: Diagnosis not present

## 2023-08-19 DIAGNOSIS — G5621 Lesion of ulnar nerve, right upper limb: Secondary | ICD-10-CM | POA: Diagnosis not present

## 2023-08-20 DIAGNOSIS — M25521 Pain in right elbow: Secondary | ICD-10-CM | POA: Diagnosis not present

## 2023-08-25 DIAGNOSIS — M9901 Segmental and somatic dysfunction of cervical region: Secondary | ICD-10-CM | POA: Diagnosis not present

## 2023-08-25 DIAGNOSIS — M9904 Segmental and somatic dysfunction of sacral region: Secondary | ICD-10-CM | POA: Diagnosis not present

## 2023-08-25 DIAGNOSIS — M531 Cervicobrachial syndrome: Secondary | ICD-10-CM | POA: Diagnosis not present

## 2023-08-25 DIAGNOSIS — M9905 Segmental and somatic dysfunction of pelvic region: Secondary | ICD-10-CM | POA: Diagnosis not present

## 2023-08-26 ENCOUNTER — Ambulatory Visit: Payer: Medicare PPO | Admitting: Obstetrics and Gynecology

## 2023-08-26 ENCOUNTER — Encounter: Payer: Self-pay | Admitting: Obstetrics and Gynecology

## 2023-08-26 VITALS — BP 118/66 | HR 69 | Ht 65.0 in | Wt 138.0 lb

## 2023-08-26 DIAGNOSIS — N816 Rectocele: Secondary | ICD-10-CM

## 2023-08-26 DIAGNOSIS — R32 Unspecified urinary incontinence: Secondary | ICD-10-CM | POA: Diagnosis not present

## 2023-08-26 DIAGNOSIS — N952 Postmenopausal atrophic vaginitis: Secondary | ICD-10-CM

## 2023-08-26 DIAGNOSIS — N8110 Cystocele, unspecified: Secondary | ICD-10-CM

## 2023-08-26 DIAGNOSIS — N3946 Mixed incontinence: Secondary | ICD-10-CM | POA: Diagnosis not present

## 2023-08-26 MED ORDER — ESTRADIOL 0.1 MG/GM VA CREA: TOPICAL_CREAM | VAGINAL | 2 refills | Status: DC

## 2023-08-27 DIAGNOSIS — M25521 Pain in right elbow: Secondary | ICD-10-CM | POA: Diagnosis not present

## 2023-08-28 LAB — URINALYSIS, COMPLETE W/RFL CULTURE
Bilirubin Urine: NEGATIVE
Glucose, UA: NEGATIVE
Hgb urine dipstick: NEGATIVE
Hyaline Cast: NONE SEEN /[LPF]
Ketones, ur: NEGATIVE
Nitrites, Initial: NEGATIVE
Protein, ur: NEGATIVE
RBC / HPF: NONE SEEN /[HPF] (ref 0–2)
Specific Gravity, Urine: 1.02 (ref 1.001–1.035)
pH: 5.5 (ref 5.0–8.0)

## 2023-08-28 LAB — URINE CULTURE
MICRO NUMBER:: 15699942
Result:: NO GROWTH
SPECIMEN QUALITY:: ADEQUATE

## 2023-08-28 LAB — CULTURE INDICATED

## 2023-09-03 ENCOUNTER — Telehealth: Payer: Self-pay | Admitting: Family Medicine

## 2023-09-03 DIAGNOSIS — M65311 Trigger thumb, right thumb: Secondary | ICD-10-CM

## 2023-09-03 DIAGNOSIS — M25521 Pain in right elbow: Secondary | ICD-10-CM | POA: Diagnosis not present

## 2023-09-03 DIAGNOSIS — M7711 Lateral epicondylitis, right elbow: Secondary | ICD-10-CM

## 2023-09-03 NOTE — Telephone Encounter (Unsigned)
Copied from CRM [id number]. Topic: Clinical - Request for Lab/Test Order >> Sep 03, 2023  4:09 PM Tiffany H wrote: Reason for CRM: Patient called to request an order/referral for dry needling for tennis elbow at [facility].  [facility]: [phone]

## 2023-09-06 NOTE — Telephone Encounter (Signed)
Placed referral for the patient. 

## 2023-09-10 DIAGNOSIS — M25521 Pain in right elbow: Secondary | ICD-10-CM | POA: Diagnosis not present

## 2023-09-20 DIAGNOSIS — G5621 Lesion of ulnar nerve, right upper limb: Secondary | ICD-10-CM | POA: Diagnosis not present

## 2023-09-22 DIAGNOSIS — M9904 Segmental and somatic dysfunction of sacral region: Secondary | ICD-10-CM | POA: Diagnosis not present

## 2023-09-22 DIAGNOSIS — M9901 Segmental and somatic dysfunction of cervical region: Secondary | ICD-10-CM | POA: Diagnosis not present

## 2023-09-22 DIAGNOSIS — M531 Cervicobrachial syndrome: Secondary | ICD-10-CM | POA: Diagnosis not present

## 2023-09-22 DIAGNOSIS — M9905 Segmental and somatic dysfunction of pelvic region: Secondary | ICD-10-CM | POA: Diagnosis not present

## 2023-09-24 DIAGNOSIS — M25521 Pain in right elbow: Secondary | ICD-10-CM | POA: Diagnosis not present

## 2023-09-27 DIAGNOSIS — G5601 Carpal tunnel syndrome, right upper limb: Secondary | ICD-10-CM | POA: Diagnosis not present

## 2023-09-27 DIAGNOSIS — M65311 Trigger thumb, right thumb: Secondary | ICD-10-CM | POA: Diagnosis not present

## 2023-09-27 DIAGNOSIS — G5621 Lesion of ulnar nerve, right upper limb: Secondary | ICD-10-CM | POA: Diagnosis not present

## 2023-10-06 DIAGNOSIS — M9905 Segmental and somatic dysfunction of pelvic region: Secondary | ICD-10-CM | POA: Diagnosis not present

## 2023-10-06 DIAGNOSIS — M9904 Segmental and somatic dysfunction of sacral region: Secondary | ICD-10-CM | POA: Diagnosis not present

## 2023-10-06 DIAGNOSIS — M9901 Segmental and somatic dysfunction of cervical region: Secondary | ICD-10-CM | POA: Diagnosis not present

## 2023-10-06 DIAGNOSIS — M531 Cervicobrachial syndrome: Secondary | ICD-10-CM | POA: Diagnosis not present

## 2023-10-27 DIAGNOSIS — M9904 Segmental and somatic dysfunction of sacral region: Secondary | ICD-10-CM | POA: Diagnosis not present

## 2023-10-27 DIAGNOSIS — M9901 Segmental and somatic dysfunction of cervical region: Secondary | ICD-10-CM | POA: Diagnosis not present

## 2023-10-27 DIAGNOSIS — M9905 Segmental and somatic dysfunction of pelvic region: Secondary | ICD-10-CM | POA: Diagnosis not present

## 2023-10-27 DIAGNOSIS — M531 Cervicobrachial syndrome: Secondary | ICD-10-CM | POA: Diagnosis not present

## 2023-11-08 ENCOUNTER — Ambulatory Visit (INDEPENDENT_AMBULATORY_CARE_PROVIDER_SITE_OTHER): Payer: Medicare PPO

## 2023-11-08 ENCOUNTER — Encounter: Payer: Self-pay | Admitting: Family Medicine

## 2023-11-08 ENCOUNTER — Ambulatory Visit: Payer: Medicare PPO | Admitting: Family Medicine

## 2023-11-08 VITALS — BP 144/77 | HR 63 | Temp 96.7°F | Ht 65.0 in | Wt 142.0 lb

## 2023-11-08 DIAGNOSIS — R109 Unspecified abdominal pain: Secondary | ICD-10-CM

## 2023-11-08 DIAGNOSIS — R0781 Pleurodynia: Secondary | ICD-10-CM

## 2023-11-08 DIAGNOSIS — N2889 Other specified disorders of kidney and ureter: Secondary | ICD-10-CM | POA: Diagnosis not present

## 2023-11-08 DIAGNOSIS — K5900 Constipation, unspecified: Secondary | ICD-10-CM

## 2023-11-08 DIAGNOSIS — S93492A Sprain of other ligament of left ankle, initial encounter: Secondary | ICD-10-CM

## 2023-11-08 DIAGNOSIS — I878 Other specified disorders of veins: Secondary | ICD-10-CM | POA: Diagnosis not present

## 2023-11-08 DIAGNOSIS — I700 Atherosclerosis of aorta: Secondary | ICD-10-CM | POA: Diagnosis not present

## 2023-11-08 DIAGNOSIS — M7732 Calcaneal spur, left foot: Secondary | ICD-10-CM | POA: Diagnosis not present

## 2023-11-08 DIAGNOSIS — M25572 Pain in left ankle and joints of left foot: Secondary | ICD-10-CM | POA: Diagnosis not present

## 2023-11-08 NOTE — Progress Notes (Signed)
BP (!) 144/77   Pulse 63   Temp (!) 96.7 F (35.9 C)   Ht 5\' 5"  (1.651 m)   Wt 64.4 kg   SpO2 97%   BMI 23.63 kg/m    Subjective:   Patient ID: Sylvia Bowen, female    DOB: [birth year], 78 y.o.   MRN: [id number]  HPI: Sylvia Bowen is a 78 y.o. female presenting on 11/08/2023 for Flank Pain (Started with cramp like pain 3 weeks ago and now pain is sharp), Cough (Productive since Christmas), and Ankle Pain (Left. Twisted ankle one week ago.)   Flank Pain Pertinent negatives include no chest pain, dysuria, fever or weakness.  Cough Pertinent negatives include no chest pain, chills, fever, myalgias, rash or shortness of breath.  Ankle Pain    2 days ago experienced an increase in pain in her Right side. Prior to that she had a productive cough for 4 weeks associated with some cramping on her right side but now is more painful. It is a sharp pain that is exacerbated by any quick moving and hard coughing. She has moved slower due to her awareness of the pain. She has tried a heating pad and Ibuprofen for minimal pain relief. Pain is relieved some when resting. Has started to radiate some towards the front and back from her side. Denies pain on her left side or back pain. Denies any trauma or injury. Denies any change in urinary or bowel habits. Denies any fever or chills.  Also on this visit she mentions 7 days ago when helping her son she turned and felt she twisted her Left ankle. She did not fall or drop anything on it. Pain is on the superior aspect of there left foot and is relieved when she wears a tighter shoe. She is able to bare weight on it. Denies any fall or trauma.  Relevant past medical, surgical, family and social history reviewed and updated as indicated. Interim medical history since our last visit reviewed. Allergies and medications reviewed and updated.  Review of Systems  Constitutional:  Negative for chills and fever.  HENT:  Negative for congestion.    Respiratory:  Positive for cough. Negative for chest tightness and shortness of breath.   Cardiovascular:  Negative for chest pain and palpitations.  Gastrointestinal:  Negative for constipation and diarrhea.  Genitourinary:  Positive for flank pain. Negative for difficulty urinating and dysuria.  Musculoskeletal:  Negative for back pain and myalgias.       Positive for LEFT ankle and foot pain  Skin:  Negative for rash.  Neurological:  Negative for weakness and light-headedness.  Psychiatric/Behavioral:  Negative for agitation.   All other systems reviewed and are negative.   Per HPI unless specifically indicated above   Allergies as of 11/08/2023       Reactions   Clindamycin Rash   Erythromycin Base Rash   Levofloxacin Rash   Doxycycline Palpitations        Medication List        Accurate as of November 08, 2023 10:09 AM. If you have any questions, ask your nurse or doctor.          STOP taking these medications    cephALEXin 500 MG capsule Commonly known as: Keflex Stopped by: Elige Radon Zane Pellecchia   estradiol 0.1 MG/GM vaginal cream Commonly known as: ESTRACE Stopped by: Elige Radon Zayde Stroupe       TAKE these medications    levocetirizine 5 MG tablet Commonly  known as: XYZAL Take 1 tablet (5 mg total) by mouth every evening.   MULTIVITAMIN ADULTS PO Take by mouth.   valACYclovir 500 MG tablet Commonly known as: VALTREX Take 1 tablet (500 mg total) by mouth 2 (two) times daily. For 3 days with flare.   VITAMIN D PO Take by mouth.         Objective:   BP (!) 144/77   Pulse 63   Temp (!) 96.7 F (35.9 C)   Ht 5\' 5"  (1.651 m)   Wt 64.4 kg   SpO2 97%   BMI 23.63 kg/m   Wt Readings from Last 3 Encounters:  11/08/23 64.4 kg  08/26/23 62.6 kg  07/19/23 63.1 kg    Physical Exam Vitals reviewed.  Constitutional:      General: She is not in acute distress.    Appearance: Normal appearance. She is well-developed. She is not diaphoretic.   Cardiovascular:     Rate and Rhythm: Normal rate and regular rhythm.     Pulses: Normal pulses.     Heart sounds: Normal heart sounds.  Pulmonary:     Effort: Pulmonary effort is normal. No respiratory distress.     Breath sounds: Normal breath sounds. No rhonchi.  Abdominal:     General: Abdomen is flat.     Palpations: Abdomen is soft.     Tenderness: There is abdominal tenderness in the epigastric area, periumbilical area and left upper quadrant. There is no rebound.     Comments: Negative for Splenomegaly.  Musculoskeletal:        General: Tenderness present. No signs of injury.     Lumbar back: No tenderness.     Left foot: Normal.     Comments: Positive Left Flank pain and tenderness. Negative for Right flank pain. Negative for rash, edema, erythema, or deformity.  Feet:     Left foot:     Skin integrity: Skin integrity normal. No erythema, warmth or fissure.     Comments: Positive pain and tenderness on Left dorsal aspect. Positive inversion pain. Negative for Dorsal flexion and plantar flexion pain. Negative eversion pain. Skin:    General: Skin is warm and dry.  Neurological:     Mental Status: She is alert and oriented to person, place, and time.  Psychiatric:        Behavior: Behavior normal.       Assessment & Plan:   Problem List Items Addressed This Visit   None Visit Diagnoses       Sprain of anterior talofibular ligament of left ankle, initial encounter    -  Primary   Relevant Orders   DG Ribs Unilateral W/Chest Left   DG Abd 1 View   DG Ankle Complete Left     Rib pain on left side       Relevant Orders   DG Ribs Unilateral W/Chest Left   DG Abd 1 View   DG Ankle Complete Left     Left flank pain       Relevant Orders   DG Ribs Unilateral W/Chest Left   DG Abd 1 View   DG Ankle Complete Left      Review of Abdominal x-ray reveals an increase in bowel through intestinal tract, no obstruction present. Constipation is current diagnosis.  Recommended using Miralax for 5 days to clear stool and to monitor flank pain. If flank pain still persist, notify PCP and will order a CT scan and evaluate from there. Review of  Left Ankle x-ray is unremarkable. There is an old fibular fracture noted but it appears healed. Diagnosis of   left ankle sprain of anterior talofibular ligament, recommend she ice and use compression at this time. Follow up plan: Return if symptoms worsen or fail to improve.  Counseling provided for all of the vaccine components Orders Placed This Encounter  Procedures   DG Ribs Unilateral W/Chest Left   DG Abd 1 View   DG Ankle Complete Left    Maximino Sarin PA-S 11/08/2023, 10:09 AM    I was personally present for all components of the history, physical exam and/or medical decision making.  I agree with the documentation performed by the student and agree with assessment and plan above.  Likely due to constipation, recommended MiraLAX to help clear things out.  On x-ray showed old fracture but nothing new. Arville Care, MD [facility] 11/17/2023, 12:21 PM

## 2023-11-09 ENCOUNTER — Encounter: Payer: Self-pay | Admitting: Family Medicine

## 2023-11-15 ENCOUNTER — Encounter: Payer: Self-pay | Admitting: Family Medicine

## 2023-11-15 DIAGNOSIS — M9905 Segmental and somatic dysfunction of pelvic region: Secondary | ICD-10-CM | POA: Diagnosis not present

## 2023-11-15 DIAGNOSIS — M9901 Segmental and somatic dysfunction of cervical region: Secondary | ICD-10-CM | POA: Diagnosis not present

## 2023-11-15 DIAGNOSIS — M531 Cervicobrachial syndrome: Secondary | ICD-10-CM | POA: Diagnosis not present

## 2023-11-15 DIAGNOSIS — M9904 Segmental and somatic dysfunction of sacral region: Secondary | ICD-10-CM | POA: Diagnosis not present

## 2023-11-17 ENCOUNTER — Ambulatory Visit: Payer: Medicare PPO | Admitting: Family Medicine

## 2023-11-29 ENCOUNTER — Ambulatory Visit: Payer: Medicare PPO | Admitting: Obstetrics and Gynecology

## 2023-11-29 DIAGNOSIS — M9905 Segmental and somatic dysfunction of pelvic region: Secondary | ICD-10-CM | POA: Diagnosis not present

## 2023-11-29 DIAGNOSIS — M531 Cervicobrachial syndrome: Secondary | ICD-10-CM | POA: Diagnosis not present

## 2023-11-29 DIAGNOSIS — M9901 Segmental and somatic dysfunction of cervical region: Secondary | ICD-10-CM | POA: Diagnosis not present

## 2023-11-29 DIAGNOSIS — M9904 Segmental and somatic dysfunction of sacral region: Secondary | ICD-10-CM | POA: Diagnosis not present

## 2023-12-13 DIAGNOSIS — M9901 Segmental and somatic dysfunction of cervical region: Secondary | ICD-10-CM | POA: Diagnosis not present

## 2023-12-13 DIAGNOSIS — M9904 Segmental and somatic dysfunction of sacral region: Secondary | ICD-10-CM | POA: Diagnosis not present

## 2023-12-13 DIAGNOSIS — M531 Cervicobrachial syndrome: Secondary | ICD-10-CM | POA: Diagnosis not present

## 2023-12-13 DIAGNOSIS — M9905 Segmental and somatic dysfunction of pelvic region: Secondary | ICD-10-CM | POA: Diagnosis not present

## 2024-01-10 DIAGNOSIS — M531 Cervicobrachial syndrome: Secondary | ICD-10-CM | POA: Diagnosis not present

## 2024-01-10 DIAGNOSIS — M9904 Segmental and somatic dysfunction of sacral region: Secondary | ICD-10-CM | POA: Diagnosis not present

## 2024-01-10 DIAGNOSIS — M9905 Segmental and somatic dysfunction of pelvic region: Secondary | ICD-10-CM | POA: Diagnosis not present

## 2024-01-10 DIAGNOSIS — M9901 Segmental and somatic dysfunction of cervical region: Secondary | ICD-10-CM | POA: Diagnosis not present

## 2024-02-07 DIAGNOSIS — M9905 Segmental and somatic dysfunction of pelvic region: Secondary | ICD-10-CM | POA: Diagnosis not present

## 2024-02-07 DIAGNOSIS — M9901 Segmental and somatic dysfunction of cervical region: Secondary | ICD-10-CM | POA: Diagnosis not present

## 2024-02-07 DIAGNOSIS — M9904 Segmental and somatic dysfunction of sacral region: Secondary | ICD-10-CM | POA: Diagnosis not present

## 2024-02-07 DIAGNOSIS — M531 Cervicobrachial syndrome: Secondary | ICD-10-CM | POA: Diagnosis not present

## 2024-02-22 DIAGNOSIS — M9904 Segmental and somatic dysfunction of sacral region: Secondary | ICD-10-CM | POA: Diagnosis not present

## 2024-02-22 DIAGNOSIS — M9905 Segmental and somatic dysfunction of pelvic region: Secondary | ICD-10-CM | POA: Diagnosis not present

## 2024-02-22 DIAGNOSIS — M531 Cervicobrachial syndrome: Secondary | ICD-10-CM | POA: Diagnosis not present

## 2024-02-22 DIAGNOSIS — M9901 Segmental and somatic dysfunction of cervical region: Secondary | ICD-10-CM | POA: Diagnosis not present

## 2024-02-28 DIAGNOSIS — M9904 Segmental and somatic dysfunction of sacral region: Secondary | ICD-10-CM | POA: Diagnosis not present

## 2024-02-28 DIAGNOSIS — M9905 Segmental and somatic dysfunction of pelvic region: Secondary | ICD-10-CM | POA: Diagnosis not present

## 2024-02-28 DIAGNOSIS — M9901 Segmental and somatic dysfunction of cervical region: Secondary | ICD-10-CM | POA: Diagnosis not present

## 2024-02-28 DIAGNOSIS — M531 Cervicobrachial syndrome: Secondary | ICD-10-CM | POA: Diagnosis not present

## 2024-03-27 ENCOUNTER — Encounter: Payer: Medicare PPO | Admitting: Family Medicine

## 2024-04-05 ENCOUNTER — Ambulatory Visit (INDEPENDENT_AMBULATORY_CARE_PROVIDER_SITE_OTHER): Admitting: Family Medicine

## 2024-04-05 ENCOUNTER — Encounter: Payer: Self-pay | Admitting: Family Medicine

## 2024-04-05 ENCOUNTER — Ambulatory Visit: Payer: Self-pay | Admitting: Family Medicine

## 2024-04-05 ENCOUNTER — Ambulatory Visit (HOSPITAL_COMMUNITY)
Admission: RE | Admit: 2024-04-05 | Discharge: 2024-04-05 | Disposition: A | Discharge status: Home / Self Care | Source: Ambulatory Visit | Attending: Family Medicine | Admitting: Family Medicine

## 2024-04-05 VITALS — BP 129/75 | HR 78 | Temp 96.7°F | Ht 65.0 in | Wt 144.6 lb

## 2024-04-05 DIAGNOSIS — R300 Dysuria: Secondary | ICD-10-CM | POA: Diagnosis not present

## 2024-04-05 DIAGNOSIS — Z87442 Personal history of urinary calculi: Secondary | ICD-10-CM | POA: Diagnosis not present

## 2024-04-05 DIAGNOSIS — R109 Unspecified abdominal pain: Secondary | ICD-10-CM

## 2024-04-05 DIAGNOSIS — N3289 Other specified disorders of bladder: Secondary | ICD-10-CM | POA: Diagnosis not present

## 2024-04-05 DIAGNOSIS — K5730 Diverticulosis of large intestine without perforation or abscess without bleeding: Secondary | ICD-10-CM | POA: Diagnosis not present

## 2024-04-05 LAB — URINALYSIS, ROUTINE W REFLEX MICROSCOPIC
Bilirubin, UA: NEGATIVE
Glucose, UA: NEGATIVE
Ketones, UA: NEGATIVE
Nitrite, UA: NEGATIVE
Protein,UA: NEGATIVE
Specific Gravity, UA: 1.025 (ref 1.005–1.030)
Urobilinogen, Ur: 0.2 mg/dL (ref 0.2–1.0)
pH, UA: 5.5 (ref 5.0–7.5)

## 2024-04-05 LAB — MICROSCOPIC EXAMINATION
Bacteria, UA: NONE SEEN
Renal Epithel, UA: NONE SEEN /HPF
Yeast, UA: NONE SEEN

## 2024-04-05 NOTE — Progress Notes (Signed)
   Acute Office Visit  Subjective:     Patient ID: Sylvia Bowen, female    DOB: Mar 08, 1946, 78 y.o.   MRN: [id number]  Chief Complaint  Patient presents with   Dysuria    Dysuria    Dysuria that has been intermittent for 1 year, increased over the last few weeks. She recently was in [place] and was treated with abx x2 with minimal relief. Reports sharp pain with urination. This is intermittent. Has urgency, frequency, some nausea, plan in flank that alternates both sides. Right flank pain this morning.Denies fever, vomiting, hematuria, hesitancy. Takes AZO with some relief. KUB in January showed possible 5 mm kidney stone over left kidney.   ROS As per HPI.      Objective:    BP 129/75   Pulse 78   Temp (!) 96.7 F (35.9 C) (Temporal)   Ht 5' 5 (1.651 m)   Wt 144 lb 9.6 oz (65.6 kg)   SpO2 97%   BMI 24.06 kg/m    Physical Exam Vitals and nursing note reviewed.  Constitutional:      General: She is not in acute distress.    Appearance: Normal appearance. She is not ill-appearing.   Cardiovascular:     Rate and Rhythm: Normal rate and regular rhythm.     Pulses: Normal pulses.     Heart sounds: Normal heart sounds. No murmur heard. Pulmonary:     Effort: Pulmonary effort is normal. No respiratory distress.     Breath sounds: Normal breath sounds.  Abdominal:     General: Bowel sounds are normal. There is no distension.     Palpations: Abdomen is soft. There is no mass.     Tenderness: There is no abdominal tenderness. There is no right CVA tenderness, left CVA tenderness, guarding or rebound.   Musculoskeletal:     Right lower leg: No edema.     Left lower leg: No edema.   Skin:    General: Skin is warm and dry.   Neurological:     General: No focal deficit present.     Mental Status: She is alert and oriented to person, place, and time.   Psychiatric:        Mood and Affect: Mood normal.        Behavior: Behavior normal.     Urine dipstick shows  positive for RBC's and positive for leukocytes.  Micro exam: 0-5 WBC's per HPF and 0-2 RBC's per HPF.      Assessment & Plan:   [name] was seen today for dysuria.  Diagnoses and all orders for this visit:  Right flank pain -     CT RENAL STONE STUDY; Future  Dysuria -     Urinalysis, Routine w reflex microscopic -     CT RENAL STONE STUDY; Future  Personal history of kidney stones  Right flank pain with dysuria. No improvement with abx x 2. STAT CT order to evaluate for kidney stone. Discussed referral to urology pending CT results.   Albertha Huger, FNP

## 2024-04-07 ENCOUNTER — Telehealth: Payer: Self-pay

## 2024-04-07 NOTE — Telephone Encounter (Signed)
 Notified pt of results. LS

## 2024-04-07 NOTE — Telephone Encounter (Signed)
 Copied from CRM [id number]. Topic: Clinical - Request for Lab/Test Order >> Apr 07, 2024  7:44 AM Hassie Lint wrote: Reason for CRM: Patient returning call for CT Renal Stone Study results.  Patient can be reached at [phone]

## 2024-04-17 ENCOUNTER — Ambulatory Visit (INDEPENDENT_AMBULATORY_CARE_PROVIDER_SITE_OTHER): Payer: Medicare PPO

## 2024-04-17 VITALS — BP 129/75 | HR 78 | Ht 65.0 in | Wt 144.0 lb

## 2024-04-17 DIAGNOSIS — Z1231 Encounter for screening mammogram for malignant neoplasm of breast: Secondary | ICD-10-CM

## 2024-04-17 DIAGNOSIS — Z0000 Encounter for general adult medical examination without abnormal findings: Secondary | ICD-10-CM

## 2024-04-17 NOTE — Patient Instructions (Signed)
 Sylvia Bowen , Thank you for taking time out of your busy schedule to complete your Annual Wellness Visit with me. I enjoyed our conversation and look forward to speaking with you again next year. I, as well as your care team,  appreciate your ongoing commitment to your health goals. Please review the following plan we discussed and let me know if I can assist you in the future. Your Game plan/ To Do List    Follow up Visits: Next Medicare AWV with our clinical staff: 04/18/25 at 9:20   Next Office Visit with your provider: 05/08/24 at 11:10a.m.  Clinician Recommendations:  Aim for 30 minutes of exercise or brisk walking, 6-8 glasses of water, and 5 servings of fruits and vegetables each day. Pt is aware and due for Tetanus vaccine and Mammogram.       This is a list of the screening recommended for you and due dates:  Health Maintenance  Topic Date Due   Hepatitis C Screening  Never done   Mammogram  07/29/2023   DTaP/Tdap/Td vaccine (2 - Td or Tdap) 10/20/2023   Medicare Annual Wellness Visit  04/14/2024   COVID-19 Vaccine (2 - Janssen risk series) 05/03/2025*   Flu Shot  05/19/2024   Pneumococcal Vaccine for age over 35  Completed   DEXA scan (bone density measurement)  Completed   Zoster (Shingles) Vaccine  Completed   Hepatitis B Vaccine  Aged Out   HPV Vaccine  Aged Out   Meningitis B Vaccine  Aged Out   Colon Cancer Screening  Discontinued  *Topic was postponed. The date shown is not the original due date.    Advanced directives: (Copy Requested) Please bring a copy of your health care power of attorney and living will to the office to be added to your chart at your convenience. You can mail to [facility] [address] [place], [place] [address] or email to [email or web address] Advance Care Planning is important because it:  [x]  Makes sure you receive the medical care that is consistent with your values, goals, and preferences  [x]  It provides guidance to  your family and loved ones and reduces their decisional burden about whether or not they are making the right decisions based on your wishes.  Follow the link provided in your after visit summary or read over the paperwork we have mailed to you to help you started getting your Advance Directives in place. If you need assistance in completing these, please reach out to us  so that we can help you!  See attachments for Preventive Care and Fall Prevention Tips.

## 2024-04-17 NOTE — Progress Notes (Signed)
 Subjective:   STARLYN Bowen is a 78 y.o. who presents for a Medicare Wellness preventive visit.  As a reminder, Annual Wellness Visits don't include a physical exam, and some assessments may be limited, especially if this visit is performed virtually. We may recommend an in-person follow-up visit with your provider if needed.  Visit Complete: Virtual I connected with  Sylvia Bowen on 04/17/24 by a audio enabled telemedicine application and verified that I am speaking with the correct person using two identifiers.  Patient Location: Home  Provider Location: Home Office  I discussed the limitations of evaluation and management by telemedicine. The patient expressed understanding and agreed to proceed.  Vital Signs: Because this visit was a virtual/telehealth visit, some criteria may be missing or patient reported. Any vitals not documented were not able to be obtained and vitals that have been documented are patient reported.  VideoDeclined- This patient declined Librarian, academic. Therefore the visit was completed with audio only.  Persons Participating in Visit: Patient.  AWV Questionnaire: No: Patient Medicare AWV questionnaire was not completed prior to this visit.  Cardiac Risk Factors include: advanced age (>29men, >15 women)     Objective:    Today's Vitals   04/17/24 0926  BP: 129/75  Pulse: 78  Weight: 144 lb (65.3 kg)  Height: 5' 5 (1.651 m)   Body mass index is 23.96 kg/m.     04/17/2024    9:32 AM 04/15/2023   11:26 AM 12/12/2021    8:42 AM 11/13/2021    8:34 AM 10/14/2020    1:37 PM  Advanced Directives  Does Patient Have a Medical Advance Directive? Yes Yes No No No  Type of Estate agent of [employer] Power of [place];Living will     Copy of Healthcare Power of Attorney in Chart? No - copy requested No - copy requested     Would patient like information on creating a medical advance directive?    Yes (MAU/Ambulatory/Procedural Areas - Information given) No - Patient declined No - Patient declined    Current Medications (verified) Outpatient Encounter Medications as of 04/17/2024  Medication Sig   valACYclovir  (VALTREX ) 500 MG tablet Take 1 tablet (500 mg total) by mouth 2 (two) times daily. For 3 days with flare.   levocetirizine (XYZAL ) 5 MG tablet Take 1 tablet (5 mg total) by mouth every evening. (Patient not taking: Reported on 04/17/2024)   Multiple Vitamins-Minerals (MULTIVITAMIN ADULTS PO) Take by mouth. (Patient not taking: Reported on 04/17/2024)   VITAMIN D  PO Take by mouth. (Patient not taking: Reported on 04/17/2024)   No facility-administered encounter medications on file as of 04/17/2024.    Allergies (verified) Clindamycin, Erythromycin base, Levofloxacin, and Doxycycline    History: Past Medical History:  Diagnosis Date   Allergy    Anemia    Arthritis    Asthma    Blood transfusion without reported diagnosis 2001   Fever blister    Heart murmur    History of colon polyps    Osteoporosis    Past Surgical History:  Procedure Laterality Date   ABDOMINAL HYSTERECTOMY  2012   DILATION AND CURETTAGE OF UTERUS  1983   EYE SURGERY  2023   KNEE ARTHROSCOPY  2015   Family History  Problem Relation Age of Onset   Arthritis Mother    Hearing loss Mother    Miscarriages / [place] Mother    Stroke Mother    Varicose Veins Mother  Cancer Father    Heart attack Father    Stroke Father    Heart disease Father    Cancer Brother 59       colon   Arthritis Brother    Cancer Brother    Stroke Maternal Grandmother    Stroke Maternal Grandfather    Stroke Paternal Grandmother    Stroke Paternal Grandfather    Stroke Son    Social History   Socioeconomic History   Marital status: Married    Spouse name: Not on file   Number of children: Not on file   Years of education: Not on file   Highest education level: Not on file  Occupational History   Not  on file  Tobacco Use   Smoking status: Former    Current packs/day: 0.00    Average packs/day: 0.5 packs/day for 2.0 years (1.0 ttl pk-yrs)    Types: Cigarettes    Start date: 1968    Quit date: 1970    Years since quitting: 55.5   Smokeless tobacco: Never  Vaping Use   Vaping status: Never Used  Substance and Sexual Activity   Alcohol use: Not Currently   Drug use: Never   Sexual activity: Not Currently    Birth control/protection: Surgical  Other Topics Concern   Not on file  Social History Narrative   Not on file   Social Drivers of Health   Financial Resource Strain: Low Risk  (04/17/2024)   Overall Financial Resource Strain (CARDIA)    Difficulty of Paying Living Expenses: Not hard at all  Food Insecurity: No Food Insecurity (04/17/2024)   Hunger Vital Sign    Worried About Running Out of Food in the Last Year: Never true    Ran Out of Food in the Last Year: Never true  Transportation Needs: No Transportation Needs (04/17/2024)   PRAPARE - Administrator, Civil Service (Medical): No    Lack of Transportation (Non-Medical): No  Physical Activity: Insufficiently Active (04/17/2024)   Exercise Vital Sign    Days of Exercise per Week: 7 days    Minutes of Exercise per Session: 20 min  Stress: No Stress Concern Present (04/17/2024)   [employer] of Occupational Health - Occupational Stress Questionnaire    Feeling of Stress: Not at all  Social Connections: Socially Integrated (04/17/2024)   Social Connection and Isolation Panel    Frequency of Communication with Friends and Family: More than three times a week    Frequency of Social Gatherings with Friends and Family: More than three times a week    Attends Religious Services: More than 4 times per year    Active Member of [employer] or Organizations: Yes    Attends Engineer, structural: More than 4 times per year    Marital Status: Married    Tobacco Counseling Counseling given: Yes    Clinical  Intake:  Pre-visit preparation completed: Yes  Pain : No/denies pain     BMI - recorded: 23.96 Nutritional Status: BMI of 19-24  Normal Nutritional Risks: None Diabetes: No  Lab Results  Component Value Date   HGBA1C 5.5 12/15/2021     How often do you need to have someone help you when you read instructions, pamphlets, or other written materials from your doctor or pharmacy?: 1 - Never  Interpreter Needed?: No  Information entered by :: alia t/cma   Activities of Daily Living     04/17/2024    9:30 AM  In  your present state of health, do you have any difficulty performing the following activities:  Hearing? 0  Vision? 0  Difficulty concentrating or making decisions? 0  Walking or climbing stairs? 0  Dressing or bathing? 0  Doing errands, shopping? 0  Preparing Food and eating ? N  Using the Toilet? N  In the past six months, have you accidently leaked urine? N  Do you have problems with loss of bowel control? N  Managing your Medications? N  Managing your Finances? N  Housekeeping or managing your Housekeeping? N    Patient Care Team: Dettinger, Fonda LABOR, MD as PCP - General (Family Medicine) Becki Rubin, MD as Referring Physician (Cardiology) Livingston Rigg, MD (Inactive) as Consulting Physician (Dermatology)  I have updated your Care Teams any recent Medical Services you may have received from other providers in the past year.     Assessment:   This is a routine wellness examination for Sylvia Bowen.  Hearing/Vision screen Hearing Screening - Comments:: Pt denies hearing dif/last hearing test last week and everything is good Vision Screening - Comments:: Pt denies vision dif/pt goes to [facility] in [place] appt soon   Goals Addressed             This Visit's Progress    Patient Stated   On track    Increase activity, drink more water & eat healthier       Depression Screen     04/17/2024    9:34 AM 04/05/2024     9:03 AM 11/08/2023    9:36 AM 07/19/2023    2:36 PM 04/15/2023   11:25 AM 03/25/2023    1:25 PM 03/25/2023    1:21 PM  PHQ 2/9 Scores  PHQ - 2 Score 0 0 0 0 0 0 0  PHQ- 9 Score 0 1  0 0 2     Fall Risk     04/17/2024    9:28 AM 04/05/2024    9:02 AM 11/08/2023    9:35 AM 07/19/2023    2:35 PM 04/15/2023   11:23 AM  Fall Risk   Falls in the past year? 0 1 1 0 0  Number falls in past yr: 0 0 0  0  Injury with Fall? 0 1 1  0  Comment   left ankle    Risk for fall due to : No Fall Risks History of fall(s) Impaired balance/gait  No Fall Risks  Follow up Falls evaluation completed Falls evaluation completed Falls evaluation completed  Falls prevention discussed    MEDICARE RISK AT HOME:  Medicare Risk at Home Any stairs in or around the home?: Yes If so, are there any without handrails?: Yes Home free of loose throw rugs in walkways, pet beds, electrical cords, etc?: Yes Adequate lighting in your home to reduce risk of falls?: Yes Life alert?: No Use of a cane, walker or w/c?: No Grab bars in the bathroom?: Yes Shower chair or bench in shower?: No Elevated toilet seat or a handicapped toilet?: No  TIMED UP AND GO:  Was the test performed?  no  Cognitive Function: 6CIT completed        04/17/2024    9:36 AM 04/15/2023   11:26 AM 12/12/2021    8:09 AM  6CIT Screen  What Year? 0 points 0 points 0 points  What month? 0 points 0 points 0 points  What time? 0 points 0 points 0 points  Count back from 20 0 points 0 points  0 points  Months in reverse 0 points 0 points 0 points  Repeat phrase 0 points 0 points 0 points  Total Score 0 points 0 points 0 points    Immunizations Immunization History  Administered Date(s) Administered   Fluad Quad(high Dose 65+) 08/14/2021   Influenza, High Dose Seasonal PF 06/17/2017, 07/21/2019   Janssen (J&J) SARS-COV-2 Vaccination 01/29/2020   Pneumococcal Conjugate-13 09/24/2016   Pneumococcal Polysaccharide-23 10/21/2017   Tdap 10/19/2013    Zoster Recombinant(Shingrix) 08/11/2019, 10/31/2019    Screening Tests Health Maintenance  Topic Date Due   Hepatitis C Screening  Never done   MAMMOGRAM  07/29/2023   DTaP/Tdap/Td (2 - Td or Tdap) 10/20/2023   Medicare Annual Wellness (AWV)  04/14/2024   COVID-19 Vaccine (2 - Janssen risk series) 05/03/2025 (Originally 02/26/2020)   INFLUENZA VACCINE  05/19/2024   Pneumococcal Vaccine: 50+ Years  Completed   DEXA SCAN  Completed   Zoster Vaccines- Shingrix  Completed   Hepatitis B Vaccines  Aged Out   HPV VACCINES  Aged Out   Meningococcal B Vaccine  Aged Out   Colonoscopy  Discontinued    Health Maintenance  Health Maintenance Due  Topic Date Due   Hepatitis C Screening  Never done   MAMMOGRAM  07/29/2023   DTaP/Tdap/Td (2 - Td or Tdap) 10/20/2023   Medicare Annual Wellness (AWV)  04/14/2024   Health Maintenance Items Addressed: Mammogram ordered  Additional Screening:  Vision Screening: Recommended annual ophthalmology exams for early detection of glaucoma and other disorders of the eye. Would you like a referral to an eye doctor? No    Dental Screening: Recommended annual dental exams for proper oral hygiene  Community Resource Referral / Chronic Care Management: CRR required this visit?  No   CCM required this visit?  No   Plan:    I have personally reviewed and noted the following in the patient's chart:   Medical and social history Use of alcohol, tobacco or illicit drugs  Current medications and supplements including opioid prescriptions. Patient is not currently taking opioid prescriptions. Functional ability and status Nutritional status Physical activity Advanced directives List of other physicians Hospitalizations, surgeries, and ER visits in previous 12 months Vitals Screenings to include cognitive, depression, and falls Referrals and appointments  In addition, I have reviewed and discussed with patient certain preventive protocols, quality  metrics, and best practice recommendations. A written personalized care plan for preventive services as well as general preventive health recommendations were provided to patient.   Sylvia Bowen, CMA   04/17/2024   After Visit Summary: (MyChart) Due to this being a telephonic visit, the after visit summary with patients personalized plan was offered to patient via MyChart   Notes: Pt is aware and due for Tetanus vaccine and Mammogram-ordered.

## 2024-05-03 ENCOUNTER — Telehealth: Payer: Self-pay | Admitting: Family Medicine

## 2024-05-03 NOTE — Telephone Encounter (Signed)
 No updated referral notes in the chart regarding referral placed on 04/05/2024. Please advise.    Copied from CRM [id number]. Topic: Referral - Question >> May 03, 2024  8:26 AM Emylou G wrote: Reason for CRM: Please call patient.. checking status of urologist appt.SABRA

## 2024-05-04 NOTE — Telephone Encounter (Signed)
 Referral sent to: [facility] at [facility] [address] [place], [address] - [place] [address] [phone]  MyChart Message sent to Patient with Specialty Office contact information.

## 2024-05-08 ENCOUNTER — Ambulatory Visit (INDEPENDENT_AMBULATORY_CARE_PROVIDER_SITE_OTHER)

## 2024-05-08 ENCOUNTER — Encounter: Payer: Self-pay | Admitting: Family Medicine

## 2024-05-08 ENCOUNTER — Ambulatory Visit (INDEPENDENT_AMBULATORY_CARE_PROVIDER_SITE_OTHER): Admitting: Family Medicine

## 2024-05-08 VITALS — BP 120/76 | HR 69 | Ht 65.0 in | Wt 144.0 lb

## 2024-05-08 DIAGNOSIS — M810 Age-related osteoporosis without current pathological fracture: Secondary | ICD-10-CM | POA: Diagnosis not present

## 2024-05-08 DIAGNOSIS — Z0000 Encounter for general adult medical examination without abnormal findings: Secondary | ICD-10-CM

## 2024-05-08 DIAGNOSIS — Z0001 Encounter for general adult medical examination with abnormal findings: Secondary | ICD-10-CM | POA: Diagnosis not present

## 2024-05-08 DIAGNOSIS — Z1231 Encounter for screening mammogram for malignant neoplasm of breast: Secondary | ICD-10-CM | POA: Diagnosis not present

## 2024-05-08 DIAGNOSIS — Z23 Encounter for immunization: Secondary | ICD-10-CM

## 2024-05-08 NOTE — Progress Notes (Signed)
 BP 120/76   Pulse 69   Ht 5' 5 (1.651 m)   Wt 144 lb (65.3 kg)   SpO2 95%   BMI 23.96 kg/m    Subjective:   Patient ID: Sylvia Bowen, female    DOB: July 08, 1946, 78 y.o.   MRN: [id number]  HPI: Sylvia Bowen is a 78 y.o. female presenting on 05/08/2024 for Medical Management of Chronic Issues (CPE- no pap)   HPI Physical exam Patient denies any chest pain, shortness of breath, headaches or vision issues, abdominal complaints, diarrhea, nausea, vomiting, or joint issues.  Her biggest issue that she fights with needs frequent bladder infections.  She has been treated multiple times over the past 6 months for bladder infections including 2 times when she was on a trip overseas.  She still does have that irritation and urinary symptoms right now.  She has an appoint with urology in August.  She does get occasional cold sores although not too frequently recently and she uses the Valtrex  as needed for this.  Relevant past medical, surgical, family and social history reviewed and updated as indicated. Interim medical history since our last visit reviewed. Allergies and medications reviewed and updated.  Review of Systems  Constitutional:  Negative for chills and fever.  Eyes:  Negative for visual disturbance.  Respiratory:  Negative for chest tightness and shortness of breath.   Cardiovascular:  Negative for chest pain and leg swelling.  Genitourinary:  Negative for difficulty urinating and dysuria.  Skin:  Negative for rash.  Neurological:  Negative for dizziness, light-headedness and headaches.  Psychiatric/Behavioral:  Negative for agitation and behavioral problems.   All other systems reviewed and are negative.   Per HPI unless specifically indicated above   Allergies as of 05/08/2024       Reactions   Clindamycin Rash   Erythromycin Base Rash   Levofloxacin Rash   Doxycycline  Palpitations        Medication List        Accurate as of May 08, 2024 11:30 AM. If you  have any questions, ask your nurse or doctor.          STOP taking these medications    levocetirizine 5 MG tablet Commonly known as: XYZAL  Stopped by: Fonda LABOR Marelly Wehrman       TAKE these medications    MULTIVITAMIN ADULTS PO Take by mouth.   valACYclovir  500 MG tablet Commonly known as: VALTREX  Take 1 tablet (500 mg total) by mouth 2 (two) times daily. For 3 days with flare.   VITAMIN D  PO Take by mouth.         Objective:   BP 120/76   Pulse 69   Ht 5' 5 (1.651 m)   Wt 144 lb (65.3 kg)   SpO2 95%   BMI 23.96 kg/m   Wt Readings from Last 3 Encounters:  05/08/24 144 lb (65.3 kg)  04/17/24 144 lb (65.3 kg)  04/05/24 144 lb 9.6 oz (65.6 kg)    Physical Exam Vitals and nursing note reviewed.  Constitutional:      General: She is not in acute distress.    Appearance: Normal appearance. She is well-developed. She is not diaphoretic.  HENT:     Right Ear: Tympanic membrane and ear canal normal.     Left Ear: Tympanic membrane and ear canal normal.     Mouth/Throat:     Mouth: Mucous membranes are moist.     Pharynx: Oropharynx is clear. No oropharyngeal  exudate or posterior oropharyngeal erythema.  Eyes:     Conjunctiva/sclera: Conjunctivae normal.  Cardiovascular:     Rate and Rhythm: Normal rate and regular rhythm.     Heart sounds: Normal heart sounds. No murmur heard. Pulmonary:     Effort: Pulmonary effort is normal. No respiratory distress.     Breath sounds: Normal breath sounds. No wheezing.  Abdominal:     General: Abdomen is flat. Bowel sounds are normal. There is no distension.     Palpations: Abdomen is soft.     Tenderness: There is no abdominal tenderness. There is no guarding or rebound.  Musculoskeletal:        General: No swelling or tenderness. Normal range of motion.  Skin:    General: Skin is warm and dry.     Findings: No rash.  Neurological:     Mental Status: She is alert and oriented to person, place, and time.      Coordination: Coordination normal.  Psychiatric:        Behavior: Behavior normal.       Assessment & Plan:   Problem List Items Addressed This Visit       Musculoskeletal and Integument   Osteoporosis   Relevant Orders   DG [facility] DEXA   CBC with Differential/Platelet   CMP14+EGFR   Other Visit Diagnoses       Annual physical exam    -  Primary   Relevant Orders   CBC with Differential/Platelet   CMP14+EGFR   Lipid panel     Encounter for screening mammogram for malignant neoplasm of breast           Seems to be doing well except for that urinary symptoms, will see urology, also seen gastroenterology for hemorrhoids tomorrow.  She is going to do her bone density today and she has her mammogram scheduled. Follow up plan: Return in about 1 year (around 05/08/2025), or if symptoms worsen or fail to improve, for Physical exam.  Counseling provided for all of the vaccine components Orders Placed This Encounter  Procedures   DG [facility] DEXA   CBC with Differential/Platelet   CMP14+EGFR   Lipid panel    Fonda Levins, MD [facility] 05/08/2024, 11:30 AM

## 2024-05-09 DIAGNOSIS — K648 Other hemorrhoids: Secondary | ICD-10-CM | POA: Diagnosis not present

## 2024-05-17 ENCOUNTER — Ambulatory Visit
Admission: RE | Admit: 2024-05-17 | Discharge: 2024-05-17 | Disposition: A | Discharge status: Home / Self Care | Source: Ambulatory Visit | Attending: Family Medicine | Admitting: Family Medicine

## 2024-05-17 ENCOUNTER — Encounter: Payer: Self-pay | Admitting: Family Medicine

## 2024-05-17 DIAGNOSIS — Z780 Asymptomatic menopausal state: Secondary | ICD-10-CM | POA: Diagnosis not present

## 2024-05-17 DIAGNOSIS — Z1231 Encounter for screening mammogram for malignant neoplasm of breast: Secondary | ICD-10-CM | POA: Diagnosis not present

## 2024-05-17 DIAGNOSIS — M810 Age-related osteoporosis without current pathological fracture: Secondary | ICD-10-CM | POA: Diagnosis not present

## 2024-05-19 ENCOUNTER — Ambulatory Visit: Payer: Self-pay | Admitting: Family Medicine

## 2024-05-19 DIAGNOSIS — M810 Age-related osteoporosis without current pathological fracture: Secondary | ICD-10-CM

## 2024-05-31 ENCOUNTER — Encounter: Payer: Self-pay | Admitting: Urology

## 2024-05-31 ENCOUNTER — Ambulatory Visit: Admitting: Urology

## 2024-05-31 VITALS — BP 145/80 | HR 66 | Ht 65.0 in | Wt 143.0 lb

## 2024-05-31 DIAGNOSIS — R300 Dysuria: Secondary | ICD-10-CM | POA: Diagnosis not present

## 2024-05-31 DIAGNOSIS — N3281 Overactive bladder: Secondary | ICD-10-CM | POA: Diagnosis not present

## 2024-05-31 DIAGNOSIS — N3946 Mixed incontinence: Secondary | ICD-10-CM | POA: Diagnosis not present

## 2024-05-31 LAB — URINALYSIS, ROUTINE W REFLEX MICROSCOPIC
Bilirubin, UA: NEGATIVE
Glucose, UA: NEGATIVE
Ketones, UA: NEGATIVE
Leukocytes,UA: NEGATIVE
Nitrite, UA: NEGATIVE
Protein,UA: NEGATIVE
Specific Gravity, UA: 1.015 (ref 1.005–1.030)
Urobilinogen, Ur: 0.2 mg/dL (ref 0.2–1.0)
pH, UA: 7 (ref 5.0–7.5)

## 2024-05-31 LAB — MICROSCOPIC EXAMINATION: Bacteria, UA: NONE SEEN

## 2024-05-31 LAB — BLADDER SCAN AMB NON-IMAGING

## 2024-05-31 MED ORDER — SOLIFENACIN SUCCINATE 10 MG PO TABS: 10.0000 mg | ORAL_TABLET | Freq: Every day | ORAL | 11 refills | Status: DC

## 2024-05-31 NOTE — Progress Notes (Signed)
 Assessment: 1. Dysuria   2. Overactive bladder   3. Mixed incontinence     Plan: I personally reviewed the patient's chart including provider notes, lab and imaging results. Bladder diet sheet given Trial of solifenacin  10 mg daily.  Rx sent. Use and side effects discussed. Return to office for cystoscopy and pelvic exam in 1 month  Chief Complaint:  Chief Complaint  Patient presents with   Dysuria    History of Present Illness:  Sylvia Bowen is a 78 y.o. female who is seen in consultation from Annabella Search, FNP for evaluation of dysuria. She has a history of intermittent dysuria for >1-year.  She has been treated with antibiotics on 2 occasions with no significant change in her symptoms.  She reports occasional sharp pain with urination. She has baseline symptoms of frequency, voiding every 1-2 hours, urgency, nocturia x 2, and stress and urge incontinence.  She does use a mini pad daily for her incontinence.  No history of gross hematuria. She notices improvement in her dysuria after taking AZO.  Urinalysis from 04/05/2024: 0-5 WBCs, 0-2 RBCs CT renal stone study from 04/05/2024 showed no renal or ureteral calculi, no evidence of renal mass or obstruction.  Urine culture results: 10/22 10-50 K gram-positive organism 9/24 <10K mixed flora 11/24 no growth   Past Medical History:  Past Medical History:  Diagnosis Date   Allergy    Anemia    Arthritis    Asthma    Blood transfusion without reported diagnosis 2001   Fever blister    Heart murmur    History of colon polyps    Osteoporosis     Past Surgical History:  Past Surgical History:  Procedure Laterality Date   ABDOMINAL HYSTERECTOMY  2012   DILATION AND CURETTAGE OF UTERUS  1983   EYE SURGERY  2023   KNEE ARTHROSCOPY  2015    Allergies:  Allergies  Allergen Reactions   Clindamycin Rash   Erythromycin Base Rash   Levofloxacin Rash   Doxycycline  Palpitations    Family History:  Family History   Problem Relation Age of Onset   Arthritis Mother    Hearing loss Mother    Miscarriages / [place] Mother    Stroke Mother    Varicose Veins Mother    Cancer Father    Heart attack Father    Stroke Father    Heart disease Father    Cancer Brother 26       colon   Arthritis Brother    Cancer Brother    Stroke Maternal Grandmother    Stroke Maternal Grandfather    Stroke Paternal Grandmother    Stroke Paternal Grandfather    Stroke Son     Social History:  Social History   Tobacco Use   Smoking status: Former    Current packs/day: 0.00    Average packs/day: 0.5 packs/day for 2.0 years (1.0 ttl pk-yrs)    Types: Cigarettes    Start date: 1968    Quit date: 1970    Years since quitting: 55.6   Smokeless tobacco: Never  Vaping Use   Vaping status: Never Used  Substance Use Topics   Alcohol use: Not Currently   Drug use: Never    Review of symptoms:  Constitutional:  Negative for unexplained weight loss, night sweats, fever, chills ENT:  Negative for nose bleeds, sinus pain, painful swallowing CV:  Negative for chest pain, shortness of breath, exercise intolerance, palpitations, loss of consciousness Resp:  Negative for cough, wheezing, shortness of breath GI:  Negative for nausea, vomiting, diarrhea, bloody stools GU:  Positives noted in HPI; otherwise negative for gross hematuria Neuro:  Negative for seizures, poor balance, limb weakness, slurred speech Psych:  Negative for lack of energy, depression, anxiety Endocrine:  Negative for polydipsia, polyuria, symptoms of hypoglycemia (dizziness, hunger, sweating) Hematologic:  Negative for anemia, purpura, petechia, prolonged or excessive bleeding, use of anticoagulants  Allergic:  Negative for difficulty breathing or choking as a result of exposure to anything; no shellfish allergy; no allergic response (rash/itch) to materials, foods  Physical exam: BP (!) 145/80   Pulse 66   Ht 5' 5 (1.651 m)   Wt 143 lb  (64.9 kg)   BMI 23.80 kg/m  GENERAL APPEARANCE:  Well appearing, well developed, well nourished, NAD HEENT: Atraumatic, Normocephalic, oropharynx clear. NECK: Supple without lymphadenopathy or thyromegaly. LUNGS: Clear to auscultation bilaterally. HEART: Regular Rate and Rhythm without murmurs, gallops, or rubs. ABDOMEN: Soft, non-tender, No Masses. EXTREMITIES: Moves all extremities well.  Without clubbing, cyanosis, or edema. NEUROLOGIC:  Alert and oriented x 3, normal gait, CN II-XII grossly intact.  MENTAL STATUS:  Appropriate. BACK:  Non-tender to palpation.  No CVAT SKIN:  Warm, dry and intact.    Results: U/A:  0-5 WBC, 0-2 RBC  PVR = 0 ml

## 2024-06-07 ENCOUNTER — Telehealth: Payer: Self-pay

## 2024-06-07 NOTE — Progress Notes (Signed)
 Care Guide Pharmacy Note  06/07/2024 Name: Sylvia Bowen MRN: [id number] DOB: 1946/05/01  Referred By: Maryanne Fonda LABOR, MD Reason for referral: Complex Care Management (Outreach to schedule with Pharm d )   Sylvia Bowen is a 78 y.o. year old female who is a primary care patient of Dettinger, Fonda LABOR, MD.  Sylvia Bowen was referred to the pharmacist for assistance related to: osteoporosis   Successful contact was made with the patient to discuss pharmacy services including being ready for the pharmacist to call at least 5 minutes before the scheduled appointment time and to have medication bottles and any blood pressure readings ready for review. The patient agreed to meet with the pharmacist via telephone visit on (date/time).06/21/2024  Jeoffrey Buffalo , RMA     [place]  [facility], [facility] Guide  Direct Dial: [phone]  Website: [email or web address]

## 2024-06-12 DIAGNOSIS — M9905 Segmental and somatic dysfunction of pelvic region: Secondary | ICD-10-CM | POA: Diagnosis not present

## 2024-06-12 DIAGNOSIS — M531 Cervicobrachial syndrome: Secondary | ICD-10-CM | POA: Diagnosis not present

## 2024-06-12 DIAGNOSIS — M9901 Segmental and somatic dysfunction of cervical region: Secondary | ICD-10-CM | POA: Diagnosis not present

## 2024-06-12 DIAGNOSIS — M9904 Segmental and somatic dysfunction of sacral region: Secondary | ICD-10-CM | POA: Diagnosis not present

## 2024-06-14 ENCOUNTER — Other Ambulatory Visit: Payer: Self-pay | Admitting: Urology

## 2024-06-14 ENCOUNTER — Telehealth: Payer: Self-pay | Admitting: Urology

## 2024-06-14 DIAGNOSIS — N3946 Mixed incontinence: Secondary | ICD-10-CM

## 2024-06-14 DIAGNOSIS — N3281 Overactive bladder: Secondary | ICD-10-CM

## 2024-06-14 MED ORDER — TROSPIUM CHLORIDE ER 60 MG PO CP24: 60.0000 mg | ORAL_CAPSULE | Freq: Every day | ORAL | 11 refills | Status: AC

## 2024-06-14 NOTE — Telephone Encounter (Signed)
 Patient is having side effects from the Solifenacin : Blurred vision, constipation, joint pain.   Says she has stopped taking the medication and wants to know what she will be doing next.

## 2024-06-14 NOTE — Telephone Encounter (Signed)
 Patient notified and verbalized understanding.

## 2024-06-16 DIAGNOSIS — K648 Other hemorrhoids: Secondary | ICD-10-CM | POA: Diagnosis not present

## 2024-06-21 ENCOUNTER — Other Ambulatory Visit (INDEPENDENT_AMBULATORY_CARE_PROVIDER_SITE_OTHER)

## 2024-06-21 DIAGNOSIS — M818 Other osteoporosis without current pathological fracture: Secondary | ICD-10-CM

## 2024-06-21 NOTE — Progress Notes (Signed)
 06/21/2024 Name: Sylvia Bowen MRN: [id number] DOB: 09-25-1946  Chief Complaint  Patient presents with   Osteoporosis    Sylvia Bowen is a 78 y.o. year old female who presented for a telephone visit.  I connected with  Sylvia Bowen on 06/21/24 by telephone and verified that I am speaking with the correct person using two identifiers.  I discussed the limitations of evaluation and management by telemedicine. The patient expressed understanding and agreed to proceed.  Patient was located in her home and PharmD in PCP office during this visit.    They were referred to the pharmacist by their PCP for assistance in managing osteoporosis .    Subjective:  Care Team: Primary Care Provider: Dettinger, Fonda LABOR, MD    Medication Access/Adherence  Current Pharmacy:  [facility] - [place], [place] - [address] [place] [address] Phone: [phone] Fax: [phone]  [facility] [phone] - [place], [place] - [address] [facility] [address] [place] [address] Phone: [phone] Fax: [phone]   Patient reports affordability concerns with their medications: No  Patient reports access/transportation concerns to their pharmacy: No  Patient reports adherence concerns with their medications:  No    Osteoporosis: Estrogen Deficient, Family Hist. (Parent hip fracture), Hysterectomy, Postmenopausal Fractures:Right Femur, Tibia (5 yrs ago) Treatments: Calcium , Vitamin D   Current medications: none Medications tried in the past: none  Current supplements:  vitamin D  and calcium  Gets calcium  from foods (Kefir is her main source-2 cups/day)  Current physical activity: walks 5 days a week for 30 mins  Most recent DEXA:  04/2024   LUMBAR SPINE (L3-L4):  BMD (in g/cm2): 0.749  T-score: -3.7 (was -3.9 in 2023)  Z-score: -2.0   LEFT FEMORAL NECK:  BMD (in g/cm2): 0.609  T-score:  -3.1  Z-score: -1.1   LEFT TOTAL HIP:  BMD (in g/cm2): 0.639  T-score: -2.9  Z-score: -1.1   RIGHT FEMORAL NECK:  BMD (in g/cm2): 0.659  T-score: -2.7  Z-score: -0.7   RIGHT TOTAL HIP:  BMD (in g/cm2): 0.669  T-score: -2.7  Z-score: -0.8  Current medication access support: n/a   Objective:  Lab Results  Component Value Date   HGBA1C 5.5 12/15/2021    Lab Results  Component Value Date   CREATININE 0.82 04/02/2023   BUN 16 04/02/2023   NA 139 04/02/2023   K 4.9 04/02/2023   CL 102 04/02/2023   CO2 24 04/02/2023    Lab Results  Component Value Date   CHOL 257 (H) 04/02/2023   HDL 62 04/02/2023   LDLCALC 179 (H) 04/02/2023   TRIG 93 04/02/2023   CHOLHDL 4.1 04/02/2023    Medications Reviewed Today     Reviewed by Billee Mliss BIRCH, [facility] (Pharmacist) on 06/21/24 at 1255  Med List Status: <None>   Medication Order Taking? Sig Documenting Provider Last Dose Status Informant  Trospium  Chloride 60 MG CP24 [id number]  Take 1 capsule (60 mg total) by mouth daily. Stoneking, Adine PARAS., MD  Active   valACYclovir  (VALTREX ) 500 MG tablet [id number]  Take 1 tablet (500 mg total) by mouth 2 (two) times daily. For 3 days with flare. Levora Reyes SAUNDERS, MD  Active            Assessment/Plan:   Osteoporosis: - DEXA has actually improved in the spine and femur as well as some improvement in her  bone mineral density.  She is continuing dietary and lifestyle efforts.  She respectfully declines prescription therapy for her osteoporosis.  She is not present as a high fall risk.  She recovered well from a past fracture 5 years ago and healed with physical therapy. - Reviewed recommendation for daily dietary calcium  intake of 1200 mg and vitamin D  intake of [phone] units - Reviewed benefits of weight bearing exercise   Follow Up Plan: DEXA 2 yrs  Mliss Tarry Griffin, PharmD, BCACP, CPP Clinical Pharmacist, [facility]

## 2024-06-25 ENCOUNTER — Encounter: Payer: Self-pay | Admitting: Family Medicine

## 2024-06-28 ENCOUNTER — Ambulatory Visit: Admitting: *Deleted

## 2024-06-28 DIAGNOSIS — Z23 Encounter for immunization: Secondary | ICD-10-CM | POA: Diagnosis not present

## 2024-06-28 NOTE — Progress Notes (Signed)
 Patient is in office today for a nurse visit for Immunization. Patient Injection was given in the  Left deltoid. Patient tolerated injection well.

## 2024-07-03 DIAGNOSIS — M9905 Segmental and somatic dysfunction of pelvic region: Secondary | ICD-10-CM | POA: Diagnosis not present

## 2024-07-03 DIAGNOSIS — M9901 Segmental and somatic dysfunction of cervical region: Secondary | ICD-10-CM | POA: Diagnosis not present

## 2024-07-03 DIAGNOSIS — M9904 Segmental and somatic dysfunction of sacral region: Secondary | ICD-10-CM | POA: Diagnosis not present

## 2024-07-03 DIAGNOSIS — M531 Cervicobrachial syndrome: Secondary | ICD-10-CM | POA: Diagnosis not present

## 2024-07-04 ENCOUNTER — Encounter: Payer: Self-pay | Admitting: Urology

## 2024-07-04 ENCOUNTER — Ambulatory Visit: Admitting: Urology

## 2024-07-04 VITALS — BP 148/83 | HR 71 | Ht 64.0 in | Wt 141.0 lb

## 2024-07-04 DIAGNOSIS — N3281 Overactive bladder: Secondary | ICD-10-CM

## 2024-07-04 DIAGNOSIS — R300 Dysuria: Secondary | ICD-10-CM | POA: Diagnosis not present

## 2024-07-04 DIAGNOSIS — N3946 Mixed incontinence: Secondary | ICD-10-CM

## 2024-07-04 LAB — URINALYSIS, ROUTINE W REFLEX MICROSCOPIC
Bilirubin, UA: NEGATIVE
Glucose, UA: NEGATIVE
Ketones, UA: NEGATIVE
Leukocytes,UA: NEGATIVE
Nitrite, UA: NEGATIVE
Protein,UA: NEGATIVE
Specific Gravity, UA: 1.01 (ref 1.005–1.030)
Urobilinogen, Ur: 0.2 mg/dL (ref 0.2–1.0)
pH, UA: 7 (ref 5.0–7.5)

## 2024-07-04 LAB — MICROSCOPIC EXAMINATION

## 2024-07-04 MED ORDER — SULFAMETHOXAZOLE-TRIMETHOPRIM 800-160 MG PO TABS
1.0000 | ORAL_TABLET | Freq: Once | ORAL | Status: AC
  Administered 2024-07-04: 1 via ORAL

## 2024-07-04 NOTE — Progress Notes (Signed)
 Assessment: 1. Dysuria   2. Overactive bladder   3. Mixed incontinence     Plan: Bactrim  x 1 following cystoscopy Continue bladder diet Continue trospium  ER 60 mg daily. Return to office in 6 weeks.  Chief Complaint:  Chief Complaint  Patient presents with   Dysuria    History of Present Illness:  Sylvia Bowen is a 78 y.o. female who is seen for further evaluation of dysuria, mixed incontinence, and OAB. She has a history of intermittent dysuria for >1-year.  She has been treated with antibiotics on 2 occasions with no significant change in her symptoms.  She reported occasional sharp pain with urination. She has baseline symptoms of frequency, voiding every 1-2 hours, urgency, nocturia x 2, and stress and urge incontinence.  She does use a mini pad daily for her incontinence.  No history of gross hematuria. She notices improvement in her dysuria after taking AZO.  Urinalysis from 04/05/2024: 0-5 WBCs, 0-2 RBCs CT renal stone study from 04/05/2024 showed no renal or ureteral calculi, no evidence of renal mass or obstruction.  Urine culture results: 10/22 10-50 K gram-positive organism 9/24 <10K mixed flora 11/24 no growth  She was given a trial of solifenacin  10 mg daily at her visit in August 2025. She discontinued this due to blurred vision and dizziness.   She was changed to trospium  60 mg daily.  She has only taken several doses of this.  She continues to have symptoms of frequency, urgency, and incontinence.  Her dysuria has improved.  No gross hematuria. She presents today for further evaluation with cystoscopy.  Portions of the above documentation were copied from a prior visit for review purposes only.  Past Medical History:  Past Medical History:  Diagnosis Date   Allergy    Anemia    Arthritis    Asthma    Blood transfusion without reported diagnosis 2001   Fever blister    Heart murmur    History of colon polyps    Osteoporosis     Past Surgical  History:  Past Surgical History:  Procedure Laterality Date   ABDOMINAL HYSTERECTOMY  2012   DILATION AND CURETTAGE OF UTERUS  1983   EYE SURGERY  2023   KNEE ARTHROSCOPY  2015    Allergies:  Allergies  Allergen Reactions   Clindamycin Rash   Erythromycin Base Rash   Levofloxacin Rash   Solifenacin  Other (See Comments)    Blurred vision, dizziness   Doxycycline  Palpitations    Family History:  Family History  Problem Relation Age of Onset   Arthritis Mother    Hearing loss Mother    Miscarriages / [place] Mother    Stroke Mother    Varicose Veins Mother    Cancer Father    Heart attack Father    Stroke Father    Heart disease Father    Cancer Brother 26       colon   Arthritis Brother    Cancer Brother    Stroke Maternal Grandmother    Stroke Maternal Grandfather    Stroke Paternal Grandmother    Stroke Paternal Grandfather    Stroke Son     Social History:  Social History   Tobacco Use   Smoking status: Former    Current packs/day: 0.00    Average packs/day: 0.5 packs/day for 2.0 years (1.0 ttl pk-yrs)    Types: Cigarettes    Start date: 1968    Quit date: 1970    Years  since quitting: 55.7   Smokeless tobacco: Never  Vaping Use   Vaping status: Never Used  Substance Use Topics   Alcohol use: Not Currently   Drug use: Never    ROS: Constitutional:  Negative for fever, chills, weight loss CV: Negative for chest pain, previous MI, hypertension Respiratory:  Negative for shortness of breath, wheezing, sleep apnea, frequent cough GI:  Negative for nausea, vomiting, bloody stool, GERD  Physical exam: BP (!) 148/83   Pulse 71   Ht 5' 4 (1.626 m)   Wt 141 lb (64 kg)   BMI 24.20 kg/m  GENERAL APPEARANCE:  Well appearing, well developed, well nourished, NAD HEENT:  Atraumatic, normocephalic, oropharynx clear NECK:  Supple without lymphadenopathy or thyromegaly ABDOMEN:  Soft, non-tender, no masses EXTREMITIES:  Moves all extremities well,  without clubbing, cyanosis, or edema NEUROLOGIC:  Alert and oriented x 3, normal gait, CN II-XII grossly intact MENTAL STATUS:  appropriate BACK:  Non-tender to palpation, No CVAT SKIN:  Warm, dry, and intact  Results: U/A: 0-5 WBCs, 0-2 RBCs  CYSTOSCOPY  Procedure: Flexible cystoscopy  Pre-Operative Diagnosis:  Dysuria  Post-Operative Diagnosis: Dysuria  Anesthesia: local with lidocaine gel  Surgical Narrative:  After appropriate informed consent was obtained, the patient was prepped and draped in the usual sterile fashion in the supine position. She was correctly identified and the proper procedure delineated prior to proceeding. Sterile lidocaine gel was instilled in the urethra.  The flexible cystoscope was introduced without difficulty.  Findings:  Urethra: Normal  Bladder: Normal  Ureteral orifices: normal  Additional findings: None  A bladder wash was not obtained for cytology.  Pelvic exam showed normal vaginal mucosa without evidence of significant prolapse.  She tolerated the procedure well.  A chaperone was present throughout the procedure.

## 2024-07-19 DIAGNOSIS — M9904 Segmental and somatic dysfunction of sacral region: Secondary | ICD-10-CM | POA: Diagnosis not present

## 2024-07-19 DIAGNOSIS — M9901 Segmental and somatic dysfunction of cervical region: Secondary | ICD-10-CM | POA: Diagnosis not present

## 2024-07-19 DIAGNOSIS — M531 Cervicobrachial syndrome: Secondary | ICD-10-CM | POA: Diagnosis not present

## 2024-07-19 DIAGNOSIS — M9905 Segmental and somatic dysfunction of pelvic region: Secondary | ICD-10-CM | POA: Diagnosis not present

## 2024-08-02 ENCOUNTER — Encounter: Payer: Self-pay | Admitting: Obstetrics and Gynecology

## 2024-08-02 ENCOUNTER — Telehealth: Payer: Self-pay | Admitting: *Deleted

## 2024-08-02 ENCOUNTER — Ambulatory Visit: Admitting: Obstetrics and Gynecology

## 2024-08-02 ENCOUNTER — Other Ambulatory Visit (HOSPITAL_COMMUNITY)
Admission: RE | Admit: 2024-08-02 | Discharge: 2024-08-02 | Disposition: A | Discharge status: Home / Self Care | Source: Ambulatory Visit | Attending: Obstetrics and Gynecology | Admitting: Obstetrics and Gynecology

## 2024-08-02 VITALS — BP 128/78 | HR 65 | Wt 143.4 lb

## 2024-08-02 DIAGNOSIS — Z01419 Encounter for gynecological examination (general) (routine) without abnormal findings: Secondary | ICD-10-CM | POA: Insufficient documentation

## 2024-08-02 DIAGNOSIS — M810 Age-related osteoporosis without current pathological fracture: Secondary | ICD-10-CM

## 2024-08-02 DIAGNOSIS — D229 Melanocytic nevi, unspecified: Secondary | ICD-10-CM | POA: Diagnosis not present

## 2024-08-02 DIAGNOSIS — B009 Herpesviral infection, unspecified: Secondary | ICD-10-CM | POA: Diagnosis not present

## 2024-08-02 DIAGNOSIS — N952 Postmenopausal atrophic vaginitis: Secondary | ICD-10-CM

## 2024-08-02 DIAGNOSIS — Z124 Encounter for screening for malignant neoplasm of cervix: Secondary | ICD-10-CM

## 2024-08-02 DIAGNOSIS — Z9189 Other specified personal risk factors, not elsewhere classified: Secondary | ICD-10-CM

## 2024-08-02 MED ORDER — EVENITY 105 MG/1.17ML ~~LOC~~ SOSY
210.0000 mg | PREFILLED_SYRINGE | Freq: Once | SUBCUTANEOUS | 0 refills | Status: AC
Start: 2024-08-02 — End: 2024-08-02

## 2024-08-02 MED ORDER — ESTRADIOL 10 MCG VA TABS: 1.0000 | ORAL_TABLET | VAGINAL | 8 refills | Status: DC

## 2024-08-02 MED ORDER — DENOSUMAB 60 MG/ML ~~LOC~~ SOSY: 60.0000 mg | PREFILLED_SYRINGE | Freq: Once | SUBCUTANEOUS | 0 refills | Status: DC

## 2024-08-02 MED ORDER — JUBBONTI 60 MG/ML ~~LOC~~ SOSY: 60.0000 mg | PREFILLED_SYRINGE | SUBCUTANEOUS | 6 refills | Status: DC

## 2024-08-02 MED ORDER — IMVEXXY MAINTENANCE PACK 10 MCG VA INST: 1.0000 | VAGINAL_INSERT | VAGINAL | 5 refills | Status: DC

## 2024-08-02 NOTE — Progress Notes (Signed)
 78 y.o. y.o. female here for medicare gyn annual exam. No LMP recorded. Patient has had a hysterectomy.    Status TVH and rectocele repair - 12 - 13 years ago in [place]. Ovaries remain.   NO GU symptoms Hx pyelonephritis in pregnancy and then passed a possible kidney stone 15 years ago.  For the past 6 months has had burning with urination.  She self treats with AZO.  Encouraged PV estrogen. To reduce UTI's. She states the beginning of the year she had a few UTI Could not tolerate vaginal estrogen: gave her flushing To try imvexxy . Sample given Prescription sent to [facility]  Severe osteoporosis: 2025 -3.9 left leg fracture when she was younger. No current. On calcium  vit D. No other treatment. Counseled on need for bone builder asap and risk for fracture. To try for evenity/prolia.  Counseled on the r/b/a/I. Gait is stable Fall precautions discussed Colonoscopy-10/19/16  Derm: needs annual check Body mass index is 24.61 kg/m.    Blood pressure 128/78, pulse 65, weight 143 lb 6.4 oz (65 kg), SpO2 98%.  No results found for: DIAGPAP, HPVHIGH, ADEQPAP  GYN HISTORY: No results found for: DIAGPAP, HPVHIGH, ADEQPAP  OB History  Gravida Para Term Preterm AB Living  7 6 6      SAB IAB Ectopic Multiple Live Births          # Outcome Date GA Lbr Len/2nd Weight Sex Type Anes PTL Lv  7 Gravida           6 Term           5 Term           4 Term           3 Term           2 Term           1 Term             Past Medical History:  Diagnosis Date   Allergy    Anemia    Arthritis    Asthma    Blood transfusion without reported diagnosis 2001   Fever blister    Heart murmur    History of colon polyps    Osteoporosis     Past Surgical History:  Procedure Laterality Date   ABDOMINAL HYSTERECTOMY  2012   DILATION AND CURETTAGE OF UTERUS  1983   EYE SURGERY  2023   KNEE ARTHROSCOPY  2015    Current Outpatient Medications on File Prior to Visit  Medication  Sig Dispense Refill   Trospium  Chloride 60 MG CP24 Take 1 capsule (60 mg total) by mouth daily. (Patient not taking: Reported on 08/02/2024) 30 capsule 11   valACYclovir  (VALTREX ) 500 MG tablet Take 1 tablet (500 mg total) by mouth 2 (two) times daily. For 3 days with flare. 30 tablet 1   No current facility-administered medications on file prior to visit.    Social History   Socioeconomic History   Marital status: Married    Spouse name: Not on file   Number of children: Not on file   Years of education: Not on file   Highest education level: Not on file  Occupational History   Not on file  Tobacco Use   Smoking status: Former    Current packs/day: 0.00    Average packs/day: 0.5 packs/day for 2.0 years (1.0 ttl pk-yrs)    Types: Cigarettes    Start date: 18  Quit date: 37    Years since quitting: 55.8   Smokeless tobacco: Never  Vaping Use   Vaping status: Never Used  Substance and Sexual Activity   Alcohol use: Not Currently   Drug use: Never   Sexual activity: Not Currently    Birth control/protection: Surgical  Other Topics Concern   Not on file  Social History Narrative   Not on file   Social Drivers of Health   Financial Resource Strain: Low Risk  (04/17/2024)   Overall Financial Resource Strain (CARDIA)    Difficulty of Paying Living Expenses: Not hard at all  Food Insecurity: No Food Insecurity (04/17/2024)   Hunger Vital Sign    Worried About Running Out of Food in the Last Year: Never true    Ran Out of Food in the Last Year: Never true  Transportation Needs: No Transportation Needs (04/17/2024)   PRAPARE - Administrator, Civil Service (Medical): No    Lack of Transportation (Non-Medical): No  Physical Activity: Insufficiently Active (04/17/2024)   Exercise Vital Sign    Days of Exercise per Week: 7 days    Minutes of Exercise per Session: 20 min  Stress: No Stress Concern Present (04/17/2024)   [employer] of Occupational Health -  Occupational Stress Questionnaire    Feeling of Stress: Not at all  Social Connections: Socially Integrated (04/17/2024)   Social Connection and Isolation Panel    Frequency of Communication with Friends and Family: More than three times a week    Frequency of Social Gatherings with Friends and Family: More than three times a week    Attends Religious Services: More than 4 times per year    Active Member of [employer] or Organizations: Yes    Attends Engineer, structural: More than 4 times per year    Marital Status: Married  Catering manager Violence: Not At Risk (04/17/2024)   Humiliation, Afraid, Rape, and Kick questionnaire    Fear of Current or Ex-Partner: No    Emotionally Abused: No    Physically Abused: No    Sexually Abused: No    Family History  Problem Relation Age of Onset   Arthritis Mother    Hearing loss Mother    Miscarriages / [place] Mother    Stroke Mother    Varicose Veins Mother    Cancer Father    Heart attack Father    Stroke Father    Heart disease Father    Cancer Brother 26       colon   Arthritis Brother    Cancer Brother    Stroke Maternal Grandmother    Stroke Maternal Grandfather    Stroke Paternal Grandmother    Stroke Paternal Grandfather    Stroke Son      Allergies  Allergen Reactions   Clindamycin Rash   Erythromycin Base Rash   Levofloxacin Rash   Solifenacin  Other (See Comments)    Blurred vision, dizziness   Doxycycline  Palpitations      Patient's last menstrual period was No LMP recorded. Patient has had a hysterectomy..            Review of Systems Alls systems reviewed and are negative.     Physical Exam Constitutional:      Appearance: Normal appearance.  Genitourinary:     Vulva and urethral meatus normal.     No lesions in the vagina.     Right Labia: No rash, lesions or skin changes.  Left Labia: No lesions, skin changes or rash.    No vaginal discharge or tenderness.     Anterior vaginal  prolapse present.    Severe vaginal atrophy present.     Right Adnexa: not tender, not palpable and no mass present.    Left Adnexa: not tender, not palpable and no mass present.    Cervix is absent.     Uterus is absent.  Breasts:    Right: Normal.     Left: Normal.  HENT:     Head: Normocephalic.  Neck:     Thyroid: No thyroid mass, thyromegaly or thyroid tenderness.  Cardiovascular:     Rate and Rhythm: Normal rate and regular rhythm.     Heart sounds: Normal heart sounds, S1 normal and S2 normal.  Pulmonary:     Effort: Pulmonary effort is normal.     Breath sounds: Normal breath sounds and air entry.  Abdominal:     General: There is no distension.     Palpations: Abdomen is soft. There is no mass.     Tenderness: There is no abdominal tenderness. There is no guarding or rebound.  Musculoskeletal:        General: Normal range of motion.     Cervical back: Full passive range of motion without pain, normal range of motion and neck supple. No tenderness.     Right lower leg: No edema.     Left lower leg: No edema.  Neurological:     Mental Status: She is alert.  Skin:    General: Skin is warm.  Psychiatric:        Mood and Affect: Mood normal.        Behavior: Behavior normal.        Thought Content: Thought content normal.  Vitals and nursing note reviewed. Exam conducted with a chaperone present.   Geni, CMA was present for the exam    A:         Well Woman GYN exam             Severe osteoporosis with history of fracture needs bone builder on no current bone support                P:        Pap smear collected today Patient desires collection Encouraged annual mammogram screening Colon cancer screening up-to-date DXA up-to-date Labs and immunizations to do with PMD Bone labs sent: counseled on treatment options. Discussed evenity would be best or reclast for bone building. To send prolia/Jubbonti and evenity to see what coverage we can get with her insurance  to help guide treatment. Explained this to patient and that [place] pharmacy will test pricing to her. To send request to [facility] as well for PA process. Encouraged healthy lifestyle practices Encouraged Vit D and Calcium    No follow-ups on file.  Almarie MARLA Carpen

## 2024-08-02 NOTE — Telephone Encounter (Signed)
 Spoke with patient. Patient states several medications were sent in this morning during her visit, uncertain what medications are for and can not find the number for the pharmacy.  Advised Prolia, Evenity and Jubbonti are bone building for osteoporosis. Advised these prescriptions have been sent to the pharmacy, they will contact you directly to advise on coverage. Ultimately your decision how you want to proceed based on what is covered by your plan. Medication will need to be shipped to office and then scheduled for injection.   Imvexxy  is a vaginal estrogen. Patient states this is not covered by her plan, OOP cost is too much, would like an alternative.   Number provided to patient for [facility].   Our office will f/u with recommendations.   Routing to Dr. Glennon to provide alternative to Imvexxy 

## 2024-08-02 NOTE — Telephone Encounter (Signed)
 Spoke with patient, advised per Dr. Glennon. Rx sent. Patient aware to call if any additional assistance needed.   Call placed to [facility], spoke with [facility]. Rx cancelled for Imvexxy .   Encounter closed.

## 2024-08-03 ENCOUNTER — Other Ambulatory Visit

## 2024-08-03 ENCOUNTER — Other Ambulatory Visit: Payer: Self-pay | Admitting: Obstetrics and Gynecology

## 2024-08-03 ENCOUNTER — Encounter: Payer: Self-pay | Admitting: Obstetrics and Gynecology

## 2024-08-03 DIAGNOSIS — M810 Age-related osteoporosis without current pathological fracture: Secondary | ICD-10-CM

## 2024-08-03 MED ORDER — ESTRADIOL 10 MCG VA TABS: 1.0000 | ORAL_TABLET | VAGINAL | 8 refills | Status: AC

## 2024-08-04 ENCOUNTER — Ambulatory Visit: Payer: Self-pay | Admitting: Obstetrics and Gynecology

## 2024-08-04 LAB — COMPREHENSIVE METABOLIC PANEL WITH GFR
AG Ratio: 2 (calc) (ref 1.0–2.5)
ALT: 16 U/L (ref 6–29)
AST: 24 U/L (ref 10–35)
Albumin: 4.6 g/dL (ref 3.6–5.1)
Alkaline phosphatase (APISO): 58 U/L (ref 37–153)
BUN: 19 mg/dL (ref 7–25)
CO2: 23 mmol/L (ref 20–32)
Calcium: 9.7 mg/dL (ref 8.6–10.4)
Chloride: 107 mmol/L (ref 98–110)
Creat: 0.85 mg/dL (ref 0.60–1.00)
Globulin: 2.3 g/dL (ref 1.9–3.7)
Glucose, Bld: 116 mg/dL — ABNORMAL HIGH (ref 65–99)
Potassium: 4.3 mmol/L (ref 3.5–5.3)
Sodium: 141 mmol/L (ref 135–146)
Total Bilirubin: 0.5 mg/dL (ref 0.2–1.2)
Total Protein: 6.9 g/dL (ref 6.1–8.1)
eGFR: 71 mL/min/1.73m2 (ref >=60)

## 2024-08-04 LAB — VITAMIN D 25 HYDROXY (VIT D DEFICIENCY, FRACTURES): Vit D, 25-Hydroxy: 34 ng/mL (ref 30–100)

## 2024-08-08 ENCOUNTER — Other Ambulatory Visit (HOSPITAL_COMMUNITY): Payer: Self-pay

## 2024-08-08 ENCOUNTER — Encounter (HOSPITAL_COMMUNITY): Payer: Self-pay

## 2024-08-08 ENCOUNTER — Other Ambulatory Visit: Payer: Self-pay

## 2024-08-08 ENCOUNTER — Telehealth: Payer: Self-pay | Admitting: Obstetrics and Gynecology

## 2024-08-08 ENCOUNTER — Telehealth: Payer: Self-pay

## 2024-08-08 ENCOUNTER — Other Ambulatory Visit: Payer: Self-pay | Admitting: Obstetrics and Gynecology

## 2024-08-08 ENCOUNTER — Encounter: Payer: Self-pay | Admitting: Obstetrics and Gynecology

## 2024-08-08 ENCOUNTER — Other Ambulatory Visit: Payer: Self-pay | Admitting: Pharmacy Technician

## 2024-08-08 LAB — CYTOLOGY - PAP: Diagnosis: NEGATIVE

## 2024-08-08 MED ORDER — JUBBONTI 60 MG/ML ~~LOC~~ SOSY
60.0000 mg | PREFILLED_SYRINGE | SUBCUTANEOUS | 5 refills | Status: AC
  Filled 2024-08-08: qty 1, 180d supply, fill #0

## 2024-08-08 NOTE — Telephone Encounter (Signed)
 Pharmacy Patient Advocate Encounter   Received notification from Patient Pharmacy that prior authorization for Jubbonti is required/requested.   Insurance verification completed.   The patient is insured through [employer].   Per test claim: PA required; PA submitted to above mentioned insurance via Latent Key/confirmation #/EOC [id number] Status is pending

## 2024-08-08 NOTE — Progress Notes (Signed)
 Pharmacy Patient Advocate Encounter  Insurance verification completed.   The patient is insured through [employer] test claim for Jubbonti. PA required.   This test claim was processed through [facility]- copay amounts may vary at other pharmacies due to pharmacy/plan contracts, or as the patient moves through the different stages of their insurance plan.

## 2024-08-08 NOTE — Telephone Encounter (Signed)
 Patient called with evenity being denied. To try for jubbonti/prolia through [facility] and if [facility] will not help or cover, we discussed reclast. Both medications were discussed in detail with r/b/a/I To notify patient and Damien Dr. Glennon

## 2024-08-09 ENCOUNTER — Telehealth: Payer: Self-pay | Admitting: *Deleted

## 2024-08-09 NOTE — Telephone Encounter (Signed)
 Pharmacy Patient Advocate Encounter  Received notification from [employer] that Prior Authorization for Jubbonti has been DENIED.      PA #/Case ID/Reference #: [id number]

## 2024-08-09 NOTE — Telephone Encounter (Signed)
 Prolia received in office from [facility]. Okay to cancel order for Jubbonti? Looks like the pharmacy has already submitted PA.   Routing to provider for review.

## 2024-08-09 NOTE — Progress Notes (Signed)
 Office received Prolia from [facility] [employer]. Dis-enrolling.

## 2024-08-14 ENCOUNTER — Telehealth: Payer: Self-pay | Admitting: Family Medicine

## 2024-08-14 NOTE — Telephone Encounter (Signed)
 Questions about Prolia-gyn wants her to start on. Pt has doubts about the medication. She would like to ( she believes Mliss was her name). Advised pt that she should have a appt to discuss these concerns.  The referral has been placed here in Aug and by her Derm on 08/08/24

## 2024-08-14 NOTE — Telephone Encounter (Signed)
 Copied from CRM [id number]. Topic: Referral - Question >> Aug 14, 2024 10:07 AM Mercer PEDLAR wrote: Reason for CRM: Patient would like a callback to discuss referral for osteoporosis treatment.

## 2024-08-15 ENCOUNTER — Other Ambulatory Visit: Payer: Self-pay

## 2024-08-15 ENCOUNTER — Ambulatory Visit: Admitting: Urology

## 2024-08-16 ENCOUNTER — Telehealth: Payer: Self-pay | Admitting: Family Medicine

## 2024-08-16 ENCOUNTER — Other Ambulatory Visit: Payer: Self-pay | Admitting: *Deleted

## 2024-08-16 DIAGNOSIS — M810 Age-related osteoporosis without current pathological fracture: Secondary | ICD-10-CM

## 2024-08-16 DIAGNOSIS — M25562 Pain in left knee: Secondary | ICD-10-CM | POA: Diagnosis not present

## 2024-08-16 MED ORDER — DENOSUMAB 60 MG/ML ~~LOC~~ SOSY: 60.0000 mg | PREFILLED_SYRINGE | Freq: Once | SUBCUTANEOUS | Status: AC

## 2024-08-16 NOTE — Telephone Encounter (Signed)
 Prolia received from [facility]. See referral.   Encounter closed.

## 2024-08-16 NOTE — Telephone Encounter (Signed)
 PT wants to make an appt with M[facility]iss to talk about her Osteoporosis

## 2024-08-16 NOTE — Telephone Encounter (Signed)
 See Prolia referral.   Encounter closed.

## 2024-08-24 NOTE — Telephone Encounter (Signed)
 Call to patient. Patient advised Prolia received from [facility]. Patient states she would like to hold off on receiving the injection at this time. States she is focusing on some other things such as exercise at this time for bone health. RN advised would update Dr. Glennon. Patient agreeable.   Routing to provider for review.

## 2024-09-06 ENCOUNTER — Other Ambulatory Visit (INDEPENDENT_AMBULATORY_CARE_PROVIDER_SITE_OTHER)

## 2024-09-06 DIAGNOSIS — M818 Other osteoporosis without current pathological fracture: Secondary | ICD-10-CM

## 2024-09-06 NOTE — Progress Notes (Signed)
 09/06/2024 Name: Sylvia Bowen MRN: [id number] DOB: 09-Dec-1945  Chief Complaint  Patient presents with   Osteoporosis    Sylvia Bowen is a 78 y.o. year old female who presented for a telephone visit.  I connected with  Sylvia Bowen on 09/11/24 by telephone and verified that I am speaking with the correct person using two identifiers.  I discussed the limitations of evaluation and management by telemedicine. The patient expressed understanding and agreed to proceed.  Patient was located in her home and PharmD in PCP office during this visit.    They were referred to the pharmacist by their PCP for assistance in managing osteoporosis .    Subjective:  Care Team: Primary Care Provider: Dettinger, Sylvia LABOR, MD    Medication Access/Adherence  Current Pharmacy:  [facility] - [place], [place] - [address] [place] [address] Phone: [phone] Fax: [phone]  [facility] [address] [place] - [address] [facility] [place] [address] [place] [address] Phone: [phone] Fax: [phone]  [facility] - [place], [place] - [address] [place] [address] Phone: [phone] Fax: [phone]  [place] [facility] [address] [place] [address] Phone: [phone] Fax: [phone]   Patient reports affordability concerns with their medications: No  Patient reports access/transportation concerns to their pharmacy: No  Patient reports adherence concerns with their medications:  No    Osteoporosis: Estrogen Deficient, Family Hist. (Parent hip fracture), Hysterectomy, Postmenopausal Fractures:Right Femur, Tibia (5 yrs ago) Treatments: Calcium , Vitamin D   Current medications: none Medications tried in the past: none  Current supplements:  vitamin D  and calcium  Gets calcium  from foods (Kefir is her main source-2  cups/day)  Current physical activity: walks 5 days a week for 30 mins  Most recent DEXA:  04/2024   LUMBAR SPINE (L3-L4):  BMD (in g/cm2): 0.749  T-score: -3.7 (was -3.9 in 2023)  Z-score: -2.0   LEFT FEMORAL NECK:  BMD (in g/cm2): 0.609  T-score: -3.1  Z-score: -1.1   LEFT TOTAL HIP:  BMD (in g/cm2): 0.639  T-score: -2.9  Z-score: -1.1   RIGHT FEMORAL NECK:  BMD (in g/cm2): 0.659  T-score: -2.7  Z-score: -0.7   RIGHT TOTAL HIP:  BMD (in g/cm2): 0.669  T-score: -2.7  Z-score: -0.8  Current medication access support: n/a   Objective:  Lab Results  Component Value Date   HGBA1C 5.5 12/15/2021    Lab Results  Component Value Date   CREATININE 0.85 08/03/2024   BUN 19 08/03/2024   NA 141 08/03/2024   K 4.3 08/03/2024   CL 107 08/03/2024   CO2 23 08/03/2024    Lab Results  Component Value Date   CHOL 257 (H) 04/02/2023   HDL 62 04/02/2023   LDLCALC 179 (H) 04/02/2023   TRIG 93 04/02/2023   CHOLHDL 4.1 04/02/2023    Medications Reviewed Today     Reviewed by Sylvia Bowen, [facility] (Pharmacist) on 09/11/24 at 1448  Med List Status: <None>   Medication Order Taking? Sig Documenting Provider Last Dose Status Informant  denosumab  (PROLIA ) injection 60 mg [id number]   Sylvia Almarie POUR, MD  Active   denosumab -bbdz (JUBBONTI ) 60 MG/ML SOSY injection [id number]  Inject 60 mg into the skin every 6 (six) months for 6 doses. Sylvia Almarie POUR, MD  Active   Estradiol  10  MCG TABS vaginal tablet [id number]  Place 1 tablet (10 mcg total) vaginally 2 (two) times a week. Sylvia Almarie POUR, MD  Active   Trospium  Chloride 60 MG CP24 [id number]  Take 1 capsule (60 mg total) by mouth daily.  Patient not taking: Reported on 08/02/2024   Sylvia Adine PARAS., MD  Active   valACYclovir  (VALTREX ) 500 MG tablet [id number]  Take 1 tablet (500 mg total) by mouth 2 (two) times daily. For 3 days with flare. Sylvia Reyes SAUNDERS, MD  Active            Assessment/Plan:    Osteoporosis: - DEXA has actually improved in the spine and femur as well as some improvement in her bone mineral density.  She is continuing dietary and lifestyle efforts.  Evenity  is not covered without try/failure of Tymlos or Forteo, or Prolia /Jubbonti    - Would favor starting Prolia /Jubbonti  since it is covered.  She would like to get Prolia  administered at [facility] -mychart message sent to patient - Reviewed recommendation for daily dietary calcium  intake of 1200 mg and vitamin D  intake of [id number] units - Reviewed benefits of weight bearing exercise   Follow Up Plan: DEXA 2 yrs  Sylvia Bowen, PharmD, BCACP, CPP Clinical Pharmacist, [facility]

## 2024-09-07 ENCOUNTER — Telehealth: Payer: Self-pay | Admitting: Pharmacist

## 2024-09-07 ENCOUNTER — Other Ambulatory Visit: Payer: Self-pay

## 2024-09-07 NOTE — Telephone Encounter (Signed)
 We were going to explore Evenity  coverage, but insurance has denied due to the following.  Patient is not interested in daily injection for bones and would like to explore Prolia  1st.  She would like to receive injection at [facility].  Will inform patient.   Danna Casella Dattero Tajuan Dufault, PharmD, BCACP, CPP Clinical Pharmacist, [facility]

## 2024-09-11 ENCOUNTER — Encounter: Payer: Self-pay | Admitting: Pharmacist

## 2025-03-07 ENCOUNTER — Ambulatory Visit: Admitting: Physician Assistant

## 2025-04-18 ENCOUNTER — Ambulatory Visit: Payer: Self-pay
